# Patient Record
Sex: Female | Born: 1955 | Hispanic: No | Marital: Married | Smoking: Former smoker
Health system: Western US, Academic
[De-identification: ages and names within clinical notes are randomized; demographics above are authoritative.]

## PROBLEM LIST (undated history)

## (undated) DIAGNOSIS — B1920 Unspecified viral hepatitis C without hepatic coma: Secondary | ICD-10-CM

## (undated) DIAGNOSIS — J449 Chronic obstructive pulmonary disease, unspecified: Secondary | ICD-10-CM

## (undated) HISTORY — DX: Chronic obstructive pulmonary disease, unspecified: J44.9

## (undated) HISTORY — DX: Unspecified viral hepatitis C without hepatic coma: B19.20

## (undated) HISTORY — PX: NO SURGICAL HISTORY: 101002

---

## 2012-01-11 ENCOUNTER — Ambulatory Visit
Admit: 2012-01-11 | Discharge: 2012-01-11 | Disposition: A | Discharge status: Home / Self Care | Payer: BLUE CROSS/BLUE SHIELD | Attending: PULMONARY DISEASE | Admitting: PULMONARY DISEASE

## 2012-01-15 ENCOUNTER — Ambulatory Visit
Admit: 2012-01-15 | Discharge: 2012-01-15 | Disposition: A | Discharge status: Home / Self Care | Payer: BLUE CROSS/BLUE SHIELD

## 2012-02-02 ENCOUNTER — Ambulatory Visit
Admit: 2012-02-02 | Discharge: 2012-02-02 | Disposition: A | Discharge status: Home / Self Care | Payer: BLUE CROSS/BLUE SHIELD

## 2012-06-30 ENCOUNTER — Ambulatory Visit: Payer: Self-pay

## 2012-07-05 ENCOUNTER — Ambulatory Visit: Payer: Self-pay | Admitting: Internal Medicine

## 2012-07-12 ENCOUNTER — Encounter: Payer: Self-pay | Admitting: Internal Medicine

## 2012-07-12 ENCOUNTER — Ambulatory Visit: Admitting: Internal Medicine

## 2012-07-12 ENCOUNTER — Ambulatory Visit

## 2012-07-12 VITALS — BP 119/86 | HR 92 | Temp 96.3°F | Resp 16 | Ht 65.0 in | Wt 135.8 lb

## 2012-07-12 DIAGNOSIS — B1920 Unspecified viral hepatitis C without hepatic coma: Secondary | ICD-10-CM | POA: Insufficient documentation

## 2012-07-12 DIAGNOSIS — J449 Chronic obstructive pulmonary disease, unspecified: Secondary | ICD-10-CM | POA: Insufficient documentation

## 2012-07-12 MED ORDER — AZITHROMYCIN 250 MG TABLET
ORAL_TABLET | ORAL | Status: AC
Start: 2012-07-12 — End: 2013-07-12

## 2012-07-12 MED ORDER — TIOTROPIUM BROMIDE 18 MCG CAPSULE WITH INHALATION DEVICE
1.0000 | ORAL_CAPSULE | Freq: Every day | RESPIRATORY_TRACT | Status: AC
Start: 2012-07-12 — End: 2013-07-12

## 2012-07-12 MED ORDER — LEVALBUTEROL HFA 45 MCG/ACTUATION AEROSOL INHALER
2.0000 | INHALATION_SPRAY | RESPIRATORY_TRACT | Status: AC | PRN
Start: 2012-07-12 — End: 2013-07-07

## 2012-07-12 MED ORDER — AZITHROMYCIN 250 MG TABLET
ORAL_TABLET | ORAL | Status: DC
Start: 2012-07-12 — End: 2012-07-12

## 2012-07-12 MED ORDER — PREDNISONE 20 MG TABLET
20.0000 mg | ORAL_TABLET | Freq: Every day | ORAL | Status: AC
Start: 2012-07-12 — End: 2013-07-07

## 2012-07-12 NOTE — Patient Instructions (Signed)
In order to provide the best care to our patients we request that if you have any questions about today's visit or need to have studies scheduled please call the [facility] directly at [phone].  If you have a change in your condition and need to schedule an earlier appointment or need to change your scheduled appointment please contact our office at [phone].  Please do not call the [facility] to change any appointments.      Please notify us if you develop increased shortness of breath, chest pain, dizziness or fainting, increased swelling of feet and ankles, a temperature greater than 100, a cough with colored sputum, nausea, vomiting or diarrhea.  Please weigh yourself every morning at the same time. If you gain more than 3 pounds overnight or 5 pounds in one week please let us know so we can adjust your medications.  If you have other symptoms that are concerning or abnormal for you, please don't hesitate to contact us at [phone].    If you have problems after hours, on weekends or holidays, please page the on-call nurse at [phone].

## 2012-07-12 NOTE — Nursing Note (Signed)
>>   Rae Lips, MA     Tue Jul 12, 2012  1:07 PM  Patient is in room #1    Medication list given at the time of check in for review by patient, instructed patient to indicate to the doctor any corrections that need to be made for Medication Reconciliation.    Patient identifiers obtained. vital signs taken, screened for pain, allergies checked, patient roomed, and Kathrynn Running, MD notified.         Rae Lips, MA I    >> Valentino Hue, RN     Tue Jul 12, 2012 12:52 PM  Patient arrived, VSS, spo2 90% on RA, put on nasal canula 2 liters and improved, deneis any pain, Md aware and roomed in room #3.  Valentino Hue, RN

## 2012-07-12 NOTE — Progress Notes (Addendum)
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  Sylvia Carter and Sylvia Carter live in [place].. The patient was seen and examined with Jamse Arn, RRT, AE-C in a new consultation for Stage IV Very Severe COPD recently oxygen dependent on 2 L/min, Hepatitis C (no record of liver ultrasound), and rhinosinusitis for assessment of COPD control.  I reviewed the history of the present illness, including the chief complaint and interval history.  I repeated and confirmed key aspects of the physical examination as recorded by Ms. Vukovich in the EMR. I personally reviewed the images from the diagnostic studies, reviewed the chart and laboratory values.  We discussed the case and jointly formulated our recommendations. I concur with the evaluation and recommendations outlined by Ms. Vukovich below.    SYMPTOMS include SOB.  Modifying factors include rest and albuterol which when taken works in less than a minute.    Current IMPAIRMENT evidenced by symptoms and albuterol rescue and Asthma Control Test score of 11.    According to the NIH-NAEPP Expert Panel Report 3, an Asthma Control Test score between 5 and 15 indicates Very Poorly Controlled asthma, 16 to 19 Not Well Controlled asthma, and 20 to 25 Well Controlled asthma.    RISK of future acute asthma exacerbation > 3 per year.    Zero Emergency department visits, zero urgent care clinic visits, zero prednisone bursts.    REVIEW OF SYSTEMS (ROS) All systems were reviewed and are negative except as noted above.     PHYSICAL EXAMINATION  Filed Vitals:    07/12/12 1247   BP: 119/86   Pulse: 92   Temp: 35.7 C (96.3 F)   Resp: 16   Height: 1.651 m (5\' 5" )   Weight: 61.598 kg (135 lb 12.8 oz)   SpO2: 90%     Constitutional: The patient is a cachectic woman.  Ear, Nose, Mouth & Throat:  Oropharynx is benign.  no oral thrush.  Mallampati 4.  No palpable lymphadenopathy.  Sinus non-tender  no drainage.  Respiratory: Chest  clear with distant breath sounds at the apices but normal breath sounds at the bases.  Cardiovascular:  Cardiac murmur no.  Abdomen: Nontender. Trudeau sign no.  Skin: Without rash  Musculoskeletal: Peripheral edema none.  Neuro: Focal neurological findings none.    LABORATORY AND RADIOLOGIC FINDINGS  I personally reviewed and interpreted the following:  Office spirometry FEV1 0.56(37% predicted), FVC 1.45(45% predicted), FEV1/FVC ratio 39%.  Bronchodilator response present with improvement in FVC and FEV1 by 44% and 37% after 3 puffs of Xopenex MDI.  Flow-volume loop is COPD  PFTs done 02/03/2012 revealed FEV1 0.56 L and severely reduced DLco.  Following administration of bronchodilator albuterol there was a significant bronchodilator response  Chest X-Sylvia Carter abnormal with lung hyperinflation and hyperlucent apices, interstitial changes at the bases.  PET-CT scan negative for malignancy  ECHO with main, right, left PA 3.1 cm, and 2.4 cm and 2.4 cm.  No saddle emboli found.  Serum chemistries labs done revealed a chronic respiratory acidosis with metabolic compensation    IMPRESSION  1. COPD, Stage or Grade 4 Very Severe  2. GERD?  3. Rhinosinusitis, allergic/nonallergic?  4. Vocal cord dysfunction    RECOMMENDATIONS  Medical Decision Making  Established problem and new problems recognized today.  Additional workup planned.    [facility] asthma action plan  Continue Xolair injections    Asthma education provided by our [facility]  Coordinator and RRT  Black Box warnings from FDA regarding LABA-containing medications reviewed with patient    Clinical trial of one with...  Breo Ellipta 100/25 every day  Add Spiriva in one week  Total IgE  RAST Panel  Flu vaccine given.  We do not have the Pertussis vaccine.    Consider Daliresp 500 mcg every day to reduce exacerbations  Work closely with the good physicians in [facility].  Will need a second round at [facility] to complement [facility]    Face-to-face time of this new patient visit is 60 minutes. Greater than 70 % of the time was spent discussing the patient's disease, prognosis, symptoms, and treatment plan, including the proper use of inhalers.  The goals of chronic disease management were defined:  Control symptoms and prevent exacerbations.  Reduce impairment and risk from asthma to achieve better asthma control.  Discussed the importance of recognizing the difference between DANGER and DISCOMFORT from asthma.    Jamse Arn, RRT, AE-C and I thank you for the opportunity to consult on this interesting case.  The patient will return in 4 weeks at which time I will correspond with you once again.  With all good wishes, I remain    Electronically signed by:    Kathrynn Running, Johnette Abraham

## 2012-07-12 NOTE — Progress Notes (Signed)
Sylvia Carter is a 56yr old female seen for evaluation of recently diagnosed COPD, possibly asthma/COPD overlap syndrome.  Diagnosed with COPD five months ago; she is oxygen dependant on 2L. She is in a pulmonary rehabilitation program close to her home in [place].  Reports  history of several bouts of pneumonia and frequent sinus infections in childhood.  Current respiratory symptoms include DOE. No PND.  ADLs severely limited due  to capacity due to DOE. No orthopnea, occasional GERD symptoms. Symptoms have been worse in winter for several years.   No ED visits,  hospital admissions,  unplanned primary care physician office visits or prednisone uses in the past year for respiratory problems. No sinus infections in the past year, but she has had them in years past. Positive symptoms of rhinosinusitis.  Patient has a 30 pack year history of smoking, quit this year. Patient was raised in a smoking household. No history of intubation, she has a daughter with asthma. Patient has two indoor dogs.   Co-morbid conditions include hepatitis C, history of narrow angle glaucoma, chronic rhinosinusitis.  Meds: Xopenex mdi prn (3-4 x/week), Dulera 100 1 puff bid  Pre/post bronchodilator spirometry was perfomed by me with the following results FEV1 .41/.56, +37%, 16% pred, FVC 1.00/1.45, +45%, 32% pred, FEV1% 41/38.6, FEF25-75 .27/43, +59%, 10% pred, PEF 88/112, +27%, 25% pred.  Asthma Control Test was administered and the score was 11  Plan: RAST IgE, Zpak & prednisone for rescue. Trial Breo, begin Spiriva after one week.  Asthma/COPD Education was provided, use of 24-hour on-call pager, asthma action plan, device techniques were reviewed with patient     Jamse Arn, RRT, RCP, AE-C  Clinical Coordinator, [facility]

## 2012-08-16 ENCOUNTER — Ambulatory Visit

## 2012-08-30 ENCOUNTER — Ambulatory Visit: Admitting: Internal Medicine

## 2012-09-05 ENCOUNTER — Ambulatory Visit: Admit: 2012-09-05 | Discharge: 2012-09-05

## 2012-09-05 ENCOUNTER — Ambulatory Visit: Admitting: Internal Medicine

## 2012-09-05 ENCOUNTER — Ambulatory Visit

## 2012-09-05 VITALS — BP 142/84 | HR 85 | Temp 96.3°F | Resp 20 | Ht 65.0 in | Wt 141.2 lb

## 2012-09-05 NOTE — Patient Instructions (Signed)
   Expect a call from us in approximately 10 to 15 days business days to schedule your Pulmonary Function Test & Exercise Oximetry, if you do not hear from us within 15 business days please give us a call at [phone] and ask for the pre-authorization coordinator

## 2012-09-05 NOTE — Nursing Note (Signed)
>>   Rae Lips, MA     Mon Sep 05, 2012 10:53 AM  Patient is in room #4    Medication list given at the time of check in for review by patient, instructed patient to indicate to the doctor any corrections that need to be made for Medication Reconciliation.    Patient identifiers obtained. vital signs taken, screened for pain, allergies checked, patient roomed, and Kathrynn Running, MD notified.         Rae Lips, MA I

## 2012-09-13 ENCOUNTER — Ambulatory Visit: Admitting: Internal Medicine

## 2012-09-14 NOTE — Progress Notes (Signed)
Sylvia Carter is a 57yr old female seen for follow up of ACOS.  PFTs show very severe obstruction. RV 234%, TLC 149%, DLco 46%. RAST negative, IgE 270.   Has not used O2 at night for the past 3 months, but does use it with exertion. Has been through a pulmonary rehab program near her home.  Reports one or two asthma episodes, no nocturnal dyspnea, no ED visits, hospital admissions, unplanned primary care physician office visits or prednisone uses since last [facility] appointment.  Rescue drug use qd-bid.  Meds: Spiriva, Breo/Symbicort 160, Xopenex, nasal irrigation.  Spirometry was not obtained in clinic.  Asthma Control Test was administered and the score was 16. eNO 75 ppb.  Plan: Increase nasal irrigation to qd, begin Rhinocort.  Asthma Education was provided, use of 24-hour on-call pager, asthma action plan, device techniques were reviewed with patient.     Jamse Arn, RRT, RCP, AE-C  Clinical Coordinator, [facility]

## 2012-09-18 ENCOUNTER — Encounter: Payer: Self-pay | Admitting: Internal Medicine

## 2012-09-18 NOTE — Progress Notes (Signed)
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  Mykenzi and Ray live in [place]. The patient was seen and examined with Jamse Arn, RRT, AE-C in a new consultation for severe persistent asthma and Stage IV Very Severe COPD which was until recently oxygen dependent on 2 L/min, Hepatitis C (no record of liver ultrasound), and rhinosinusitis for assessment of Asthma/COPD control.  I reviewed the history of the present illness, including the chief complaint and interval history.  I repeated and confirmed key aspects of the physical examination as recorded by Ms. Vukovich in the EMR. I personally reviewed the images from the diagnostic studies, reviewed the chart and laboratory values.  We discussed the case and jointly formulated our recommendations. I concur with the evaluation and recommendations outlined by Ms. Vukovich below.    SYMPTOMS include SOB.  Modifying factors include rest and albuterol which when taken works in less than a minute.  Since her first visit and trial with Breo Ellipta 100/25 + Spiriva Pairlee is much improved.  No oxygen use in the past THREE MONTHS to Ray's consternation.    Current IMPAIRMENT evidenced by symptoms and albuterol rescue and Asthma Control Test score of 16, significantly up from 11.    According to the NIH-NAEPP Expert Panel Report 3, an Asthma Control Test score between 5 and 15 indicates Very Poorly Controlled asthma, 16 to 19 Not Well Controlled asthma, and 20 to 25 Well Controlled asthma.    RISK of future acute asthma exacerbation > 3 per year.    Zero Emergency department visits, zero urgent care clinic visits, zero prednisone bursts.    REVIEW OF SYSTEMS (ROS) All systems were reviewed and are negative except as noted above.     PHYSICAL EXAMINATION  Filed Vitals:    09/05/12 1058   BP: 142/84   Pulse: 85   Temp: 35.7 C (96.3 F)   TempSrc: Tympanic   Resp: 20   Height: 1.651 m (5\' 5" )   Weight: 64.048  kg (141 lb 3.2 oz)   SpO2: 90%     Constitutional: The patient is a cachectic woman.  Ear, Nose, Mouth & Throat:  Oropharynx is benign.  no oral thrush.  Mallampati 4.  No palpable lymphadenopathy.  Sinus non-tender  no drainage.  Respiratory: Chest clear with distant breath sounds at the apices but normal breath sounds at the bases.  Cardiovascular:  Cardiac murmur no.  Abdomen: Nontender. Trudeau sign no.  Skin: Without rash  Musculoskeletal: Peripheral edema none.  Neuro: Focal neurological findings none.    LABORATORY AND RADIOLOGIC FINDINGS  I personally reviewed and interpreted the following:  Office spirometry FEV1 0.56(37% predicted), FVC 1.45(45% predicted), FEV1/FVC ratio 39%.  Bronchodilator response present with improvement in FVC and FEV1 by 44% and 37% after 3 puffs of Xopenex MDI.  Flow-volume loop is COPD  PFTs done 02/03/2012 revealed FEV1 0.56 L and severely reduced DLco.  Following administration of bronchodilator albuterol there was a significant bronchodilator response  Chest X-ray abnormal with lung hyperinflation and hyperlucent apices, interstitial changes at the bases.  PET-CT scan negative for malignancy  ECHO with main, right, left PA 3.1 cm, and 2.4 cm and 2.4 cm.  No saddle emboli found.  Serum chemistries labs done revealed a chronic respiratory acidosis with metabolic compensation    Total IgE 270  FeNO today 75 ppb on drug treatment    IMPRESSION  1. COPD, Stage  or Grade 4 Very Severe  2. GERD?  3. Rhinosinusitis, allergic/nonallergic?  4. Vocal cord dysfunction  5. Hepatitis C - Needs to know there is TREATMENT NOW!    RECOMMENDATIONS  Medical Decision Making  Established problem and new problems recognized today.  Additional workup planned.    [facility] asthma action plan  Continue Breo + Spiriva with PRN Xopenex  Nasal irrigation daily  Begin Rhinocort  Asthma education provided by our [facility] and RRT  Black Box warnings from FDA regarding LABA-containing medications  reviewed with patient     Consider Daliresp 500 mcg every day to reduce exacerbations  Work closely with the good physicians in [facility]  Pulmonary Rehabilitaton.  Will need a second round at [facility] to complement [facility] program    Consider referral to Hepatology at [facility].    Face-to-face time of this new patient visit is 30 minutes. Greater than 70 % of the time was spent discussing the patient's disease, prognosis, symptoms, and treatment plan, including the proper use of inhalers.  The goals of chronic disease management were defined:  Control symptoms and prevent exacerbations.  Reduce impairment and risk from asthma to achieve better asthma control.  Discussed the importance of recognizing the difference between DANGER and DISCOMFORT from asthma.    Jamse Arn, RRT, AE-C and I thank you for the opportunity to consult on this interesting case.  The patient will return in 4 weeks at which time I will correspond with you once again.  With all good wishes, I remain    Electronically signed by:    Kathrynn Running, Johnette Abraham

## 2012-10-17 ENCOUNTER — Ambulatory Visit

## 2012-10-17 ENCOUNTER — Ambulatory Visit: Admitting: Internal Medicine

## 2012-10-17 VITALS — BP 146/87 | HR 79 | Temp 97.4°F | Resp 18 | Wt 146.1 lb

## 2012-10-17 NOTE — Progress Notes (Signed)
PULMONARY CLINIC   FOLLOW-UP NOTE    Reason for follow-up: COPD.    [No PMD listed to address this letter to]    I had the pleasure of seeing Sylvia Carter our [facility] in follow-up. As you may know shes is a pleasant 57 year old white woman with history of severe COPD (FEV1 0.78 L at 29%, and DLCO of 46%) on supplemental oxygen.     She has experienced much improvement on Breo (the new combination ICS/LABA inhaler) and spiriva. Breo inhaler causes mild hoarseness but is otherwise well tolerated. Denies cough, sputum production, dyspnea, or chest pain.     Uses oxygen only as needed, and not using it at night as instructed. This is due to some concern about cosmetic appearance of using O2 publically, etc.     She has completed a pulm rehab program up in [place] where she lives. Mrs Carter states she benefited from it considerably. She will start the maintenance program as well.     In clinic today, ACT 18/25 and FeNO was 50 ppb.    ROS:  A complete and comprehensive ROS was negative except for the pertinent items mentioned above.    IMMUNIZATIONS:  Influenza: UTD Oct 2013   Pneumovax: UTD 2013    MEDICATIONS:  Current Outpatient Prescriptions on File Prior to Visit   Medication Sig Dispense Refill    Azithromycin (ZITHROMAX Z-PAK) 250 mg Tablet Take 2 pills first day, then 1 pill daily till gone  6 tablet  0    Levalbuterol (XOPENEX HFA) 45 mcg/actuation Inhaler Take 2 puffs by inhalation every 4 hours if needed.  15 g  11    PredniSONE (DELTASONE) 20 mg Tablet Take 1 tablet by mouth every morning after a meal. Take 40 mg for 5 days then stop  20 tablet  0    Tiotropium (SPIRIVA WITH HANDIHALER) 18 mcg Capsule (MUST use with HandiHaler Device) Take 1 capsule by inhalation every day. Inhale the contents of one capsule in the handihaler once daily.  Do NOT swallow capsule.  30 capsule  12     No current facility-administered medications on file prior to visit.     CXR from 09/05/12  reviewed. Findings consistent with COPD and air-trapping.     PHYSICAL EXAM:  VS- BP 146/87  Pulse 79  Temp(Src) 36.3 C (97.4 F) (Tympanic)  Resp 18  Wt 66.271 kg (146 lb 1.6 oz)  BMI 24.31 kg/m2  SpO2 90% on room air.       GEN- No acute distress, pleasant.  HEENT- PERRL, EOMI, naso- and oropharynx is clear without lesions.   NECK- Supple, no LAD or JVD.  CHEST- CTA B, no wheezing, rhonchi, or crackles. Normal effort. Dec BS in bases, but no dullness.   CV- RRR, no murmurs, rubs, or gallops. S1 and S2 are audible.  ABD- Soft, +bs, nt/nd.  EXT- No c/c/e.  SKIN- No rash.  NEURO- Non-focal exam. Normal gait.      LABS:  All labs in EMR were reviewed and discussed with the patient since the last visit.  Has negative RAST panel put positive and high IgE at 270.   CRP < 0.1      PFT and from Jan 2014 reviewed in EMR.    ASSESSMENT & RECOMMENDATIONS:   (1) COPD vs. ACOS: Phenotype is consistent with non-exacerbator, no systemic inflammation based on above tests and history. However, presence of high IgE is suggestive  of ACOS (asthma COPD overlap syndrome) but not necessarily diagnostic. My biggest concern is the degree of low FEV1 and DLCO in such a young patient, and her lack of proper O2 use. Based on her past test, she needs up to 4 LPM supplemental O2 with walking. She also is not using O2 at night when she sleeps as recommended. I spent more than half of the visit educating the patient regarding her condition and reviewing the medical literature. I counseled her that O2 has a mortality benefit in COPD, and why it is important for her to use it essentially 24/7. She appreciated our discussion and plans to do so immediately.     Plan:  - Pulm Rehab -- start maintenance program  - Check noctural pulse ox --> report back to [place] here in clinic  - Use supplemental O2 continuous at night and with walking; best if used 24/7 continuous  - Cont inhaler and Breo  - Cont spiriva    Thank you for allowing me to  participate in the care of Mrs. Sylvia Carter.  I would like to see the patient back in follow-up in 2-3 months.     Time Spent: 26 Minutes (including history, physical, and review of labs and imaging studies, answering questions, and discussion of plan, of which >50% of the time was spent counseling the patient).      Sincerely,     Electronically signed by:  Corie Chiquito. Andrey Campanile, MD  Attending Physician  Assistant Professor of Medicine  Pulmonary, Critical Care, & Sleep Medicine  [facility]

## 2012-10-17 NOTE — Nursing Note (Signed)
>>   Matilde Sprang, LVN     Mon Oct 17, 2012 11:16 AM  Patient is in room #1    Medication list given at the time of check in for review by patient, instructed patient to indicate to the doctor any corrections that need to be made for Medication Reconciliation.    Patient identifiers obtained. vital signs taken, screened for pain, allergies checked, patient roomed, and doctor notified.       Contact Phone number---------[phone]   Message ok ------------------yes  Name--------------------------Sylvia Carter  DOB --------------------------- 1956-05-15  E-mail --------------------------No e-mail address on record    Ht Readings from Last 1 Encounters:  09/05/12 : 1.651 m (5\' 5" ) Wt Readings from Last 3 Encounters:  09/05/12 : 64.048 kg (141 lb 3.2 oz)  07/12/12 : 61.598 kg (135 lb 12.8 oz)     BP Readings from Last 3 Encounters:  09/05/12 : 142/84  07/12/12 : 119/86      Emeline Darling, LVN

## 2012-10-21 NOTE — Progress Notes (Signed)
Jamayia Croker is a 57yr old female seen with Dr. Andrey Campanile for follow up of ACOS.  Denies exacerbations, nocturnal dyspnea,  ED visits, hospital admissions, unplanned primary care physician office visits, antibiotic or prednisone uses since last [facility] appointment. Use of oxygen only with exertion.  Rescue drug use once in the past week.  Complains of increased hoarseness since starting Breo.  Meds:   Current Outpatient Prescriptions on File Prior to Visit   Medication Sig Dispense Refill    Azithromycin (ZITHROMAX Z-PAK) 250 mg Tablet Take 2 pills first day, then 1 pill daily till gone  6 tablet  0    Levalbuterol (XOPENEX HFA) 45 mcg/actuation Inhaler Take 2 puffs by inhalation every 4 hours if needed.  15 g  11    PredniSONE (DELTASONE) 20 mg Tablet Take 1 tablet by mouth every morning after a meal. Take 40 mg for 5 days then stop  20 tablet  0    Tiotropium (SPIRIVA WITH HANDIHALER) 18 mcg Capsule (MUST use with HandiHaler Device) Take 1 capsule by inhalation every day. Inhale the contents of one capsule in the handihaler once daily.  Do NOT swallow capsule.  30 capsule  12     No current facility-administered medications on file prior to visit.     Marland Kitchen  Spirometry was not performed.   Asthma Control Test was administered and the score was 18. eNO 50  Plan: Obtain overnight pulse oximetry study. Alternate Symbicort & Breo.  Asthma Education was provided, use of 24-hour on-call pager, asthma action plan, device techniques were reviewed with patient.     Jamse Arn, RRT, RCP, AE-C  Clinical Coordinator, [facility]

## 2012-10-24 ENCOUNTER — Encounter: Payer: Self-pay | Admitting: Internal Medicine

## 2012-10-30 NOTE — Addendum Note (Signed)
Addended by: Kathrynn Running on: 10/30/2012 01:25 AM     Modules accepted: Level of Service

## 2012-12-07 ENCOUNTER — Telehealth: Payer: Self-pay | Admitting: Internal Medicine

## 2012-12-07 ENCOUNTER — Ambulatory Visit

## 2012-12-07 NOTE — Telephone Encounter (Signed)
Called and spoke with patient.  Three patient identifiers obtained.  Rescheduled patient's appointment to 02/07/13 at 2:30pm with Dr. Magnus Ivan.  Appointment reminder mailed to patient.  She is aware and verbalizes understanding.  Rae Lips, MA I

## 2012-12-07 NOTE — Telephone Encounter (Signed)
This patient. Was scheduled for a July appt. At 9am, but due to the fact of her traveling from [place], she was requesting a later time, due to fix-50 delays, she would like to be seen in June, possibly afternoon, or the latest time that is available, pls advise and patient will be called.  Rowe Robert

## 2012-12-07 NOTE — Telephone Encounter (Signed)
Patient calling stating that she missed a call, but not sure who it was from and they called her husbands phone.  Telephone encounter not documented.    [phone] (house #)    Thank you  Neomia Glass

## 2013-02-07 ENCOUNTER — Ambulatory Visit: Payer: BLUE CROSS/BLUE SHIELD | Attending: Internal Medicine | Admitting: Internal Medicine

## 2013-02-07 VITALS — BP 124/72 | HR 101 | Temp 96.2°F | Resp 20 | Ht 65.0 in | Wt 144.0 lb

## 2013-02-07 DIAGNOSIS — B1920 Unspecified viral hepatitis C without hepatic coma: Secondary | ICD-10-CM | POA: Insufficient documentation

## 2013-02-07 DIAGNOSIS — R498 Other voice and resonance disorders: Secondary | ICD-10-CM | POA: Insufficient documentation

## 2013-02-07 DIAGNOSIS — J4489 Other specified chronic obstructive pulmonary disease: Secondary | ICD-10-CM | POA: Insufficient documentation

## 2013-02-07 DIAGNOSIS — J309 Allergic rhinitis, unspecified: Secondary | ICD-10-CM | POA: Insufficient documentation

## 2013-02-07 MED ORDER — UMECLIDINIUM 62.5 MCG-VILANTEROL 25 MCG/ACTUATION POWDR FOR INHALATION
1.0000 | DISK | Freq: Every day | RESPIRATORY_TRACT | Status: DC
Start: 2013-02-07 — End: 2014-02-07

## 2013-02-07 NOTE — Nursing Note (Signed)
>>   Sylvia Carter     Tue Feb 07, 2013  2:00 PM    Patient is in room #2    Medication list given at the time of check in for review by patient, instructed patient to indicate to the doctor any corrections that need to be made for Medication Reconciliation.    Patient identifiers obtained. vital signs taken, screened for pain, allergies checked, patient roomed, and Kathrynn Running, MD notified.           Sylvia Prjevlotzky MA

## 2013-02-10 ENCOUNTER — Encounter: Payer: Self-pay | Admitting: Internal Medicine

## 2013-02-10 NOTE — Progress Notes (Signed)
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  Sylvia Carter and Sylvia Carter live in [place]. The patient was seen and examined with Sylvia Carter, RRT, AE-C in a new consultation for severe persistent asthma and Stage IV Very Severe COPD which was until recently oxygen dependent on 2 L/min, Hepatitis C (no record of liver ultrasound), and rhinosinusitis for assessment of Asthma/COPD control.  I reviewed the history of the present illness, including the chief complaint and interval history.  I repeated and confirmed key aspects of the physical examination as recorded by Sylvia Carter in the EMR. I personally reviewed the images from the diagnostic studies, reviewed the chart and laboratory values.  We discussed the case and jointly formulated our recommendations. I concur with the evaluation and recommendations outlined by Sylvia Carter below.    SYMPTOMS include SOB.  Modifying factors include rest and albuterol which when taken works in less than a minute.  Since her first visit and trial with Breo Ellipta 100/25 + Spiriva Sylvia Carter is much improved.  No oxygen use but still significantly disabled.  Papers filled out.  COPD is the 2nd leading cause of disability in the [place], not a well-known fact..    Current IMPAIRMENT evidenced by symptoms and albuterol rescue and Asthma Control Test score of 16 still, significantly up from 11.    According to the NIH-NAEPP Expert Panel Report 3, an Asthma Control Test score between 5 and 15 indicates Very Poorly Controlled asthma, 16 to 19 Not Well Controlled asthma, and 20 to 25 Well Controlled asthma.    RISK of future acute asthma exacerbation > 3 per year.    Zero Emergency department visits, zero urgent care clinic visits, zero prednisone bursts.    REVIEW OF SYSTEMS (ROS) All systems were reviewed and are negative except as noted above.     PHYSICAL EXAMINATION  Filed Vitals:    02/07/13 1354   BP: 124/72   Pulse: 101   Temp:  35.7 C (96.2 F)   TempSrc: Tympanic   Resp: 20   Height: 1.651 m (5\' 5" )   Weight: 65.318 kg (144 lb)   SpO2: 90%     Constitutional: The patient is a cachectic woman.  Ear, Nose, Mouth & Throat:  Oropharynx is benign.  no oral thrush.  Mallampati 4.  No palpable lymphadenopathy.  Sinus non-tender  no drainage.  Respiratory: Chest clear with distant breath sounds at the apices but normal breath sounds at the bases.  Cardiovascular:  Cardiac murmur no.  Abdomen: Nontender. Trudeau sign no.  Skin: Without rash  Musculoskeletal: Peripheral edema none.  Neuro: Focal neurological findings none.    LABORATORY AND RADIOLOGIC FINDINGS  I personally reviewed and interpreted the following:  Office spirometry done last time FEV1 0.56(37% predicted), FVC 1.45(45% predicted), FEV1/FVC ratio 39%.  Bronchodilator response present with improvement in FVC and FEV1 by 44% and 37% after 3 puffs of Xopenex MDI.  Flow-volume loop is COPD  PFTs done 02/03/2012 revealed FEV1 0.56 L and severely reduced DLco.  Following administration of bronchodilator albuterol there was a significant bronchodilator response  Chest X-Sylvia Carter abnormal with lung hyperinflation and hyperlucent apices, interstitial changes at the bases.  PET-CT scan negative for malignancy  ECHO with main, right, left PA 3.1 cm, and 2.4 cm and 2.4 cm.  No saddle emboli found.  Serum chemistries labs done revealed a chronic respiratory acidosis with metabolic compensation    Total  IgE 270  FeNO last time 75 ppb on drug treatment.  Makes me think of ACOS    IMPRESSION  1. 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 GERD?  3. Rhinosinusitis, allergic/nonallergic?  4. Vocal cord dysfunction  5. Hepatitis C - Needs to know there is TREATMENT NOW!    RECOMMENDATIONS  Medical Decision Making  Established problem and new problems recognized today.  Additional workup planned.    UCAN asthma action plan  Continue Breo + Spiriva with PRN Xopenex  Nasal  irrigation daily  Asthma education provided by our [facility] and RRT  Black Box warnings from FDA regarding LABA-containing medications reviewed with patient     Consider Daliresp 500 mcg every day to reduce exacerbations  Work closely with the good physicians in [facility]  Pulmonary Rehabilitaton.  Will need a second round at [facility] to complement [facility] program    Consider referral to Hepatology at [facility].    Face-to-face time of this  patient visit is 40 minutes. Greater than 70 % of the time was spent discussing the patient's disease, prognosis, symptoms, and treatment plan, including the proper use of inhalers.  The goals of chronic disease management were defined:  Control symptoms and prevent exacerbations.  Reduce impairment and risk from asthma to achieve better asthma control.  Discussed the importance of recognizing the difference between DANGER and DISCOMFORT from asthma.    Sylvia Carter, RRT, AE-C and I thank you for the opportunity to consult on this interesting case.  The patient will return in 3 months at which time I will correspond with you once again.  With all good wishes, I remain    Electronically signed by:    Sylvia Carter, Sylvia Carter

## 2013-02-14 ENCOUNTER — Ambulatory Visit: Admitting: Internal Medicine

## 2013-08-29 ENCOUNTER — Other Ambulatory Visit: Payer: Self-pay | Admitting: Internal Medicine

## 2013-08-29 DIAGNOSIS — J449 Chronic obstructive pulmonary disease, unspecified: Principal | ICD-10-CM

## 2013-08-29 NOTE — Progress Notes (Signed)
Rc'd call from patient's daughter who reports her mother has been ill with a sinus infection but was reluctant to call herself. Per Dr. William Hamburger, patient was advised to take five days of azithromycin, which she has on hand and to come in to clinic on 2/2/ at 10 AM. She was further advised we would like to obtain new PFT & 6MW as her tests are now a year old. We will try to coordinate the testing on the same day as her appt w/ [place] as she comes from quite a distance. She was also told to please contact us with worsening symptoms or with any questions she may have.  Sherlie Ban, RRT, AE-C, CCM, TTS  Coordinator, [facility]

## 2013-09-11 ENCOUNTER — Ambulatory Visit: Payer: BLUE CROSS/BLUE SHIELD | Admitting: PULMONARY DISEASE

## 2013-10-02 ENCOUNTER — Encounter: Payer: BLUE CROSS/BLUE SHIELD | Admitting: PULMONARY DISEASE

## 2013-10-09 ENCOUNTER — Ambulatory Visit: Payer: BLUE CROSS/BLUE SHIELD | Admitting: PULMONARY DISEASE

## 2013-11-10 ENCOUNTER — Ambulatory Visit: Payer: BLUE CROSS/BLUE SHIELD

## 2013-11-20 ENCOUNTER — Ambulatory Visit
Admit: 2013-11-20 | Discharge: 2013-11-20 | Disposition: A | Discharge status: Home / Self Care | Payer: BLUE CROSS/BLUE SHIELD | Source: Ambulatory Visit | Attending: Internal Medicine | Admitting: Internal Medicine

## 2013-11-20 ENCOUNTER — Ambulatory Visit (HOSPITAL_BASED_OUTPATIENT_CLINIC_OR_DEPARTMENT_OTHER): Payer: BLUE CROSS/BLUE SHIELD | Admitting: PULMONARY DISEASE

## 2013-11-20 VITALS — BP 134/87 | HR 78 | Temp 97.6°F | Resp 16 | Ht 65.0 in | Wt 141.1 lb

## 2013-11-20 DIAGNOSIS — J310 Chronic rhinitis: Secondary | ICD-10-CM

## 2013-11-20 DIAGNOSIS — J449 Chronic obstructive pulmonary disease, unspecified: Principal | ICD-10-CM

## 2013-11-20 DIAGNOSIS — Z23 Encounter for immunization: Secondary | ICD-10-CM | POA: Insufficient documentation

## 2013-11-20 DIAGNOSIS — J4489 Other specified chronic obstructive pulmonary disease: Secondary | ICD-10-CM | POA: Insufficient documentation

## 2013-11-20 MED ORDER — FLUTICASONE PROPIONATE 50 MCG/ACTUATION NASAL SPRAY,SUSPENSION
2.0000 | Freq: Every day | NASAL | Status: AC
Start: 2013-11-20 — End: 2014-11-20

## 2013-11-20 MED ORDER — TIOTROPIUM BROMIDE 18 MCG CAPSULE WITH INHALATION DEVICE
1.0000 | ORAL_CAPSULE | Freq: Every day | RESPIRATORY_TRACT | Status: DC
Start: 2013-11-20 — End: 2015-02-11

## 2013-11-20 MED ORDER — FLUTICASONE 100 MCG-VILANTEROL 25 MCG/DOSE BREATH ACTIVATED ORAL INHALER
1.0000 | DISK | Freq: Every day | RESPIRATORY_TRACT | Status: DC
Start: 2013-11-20 — End: 2014-08-17

## 2013-11-20 MED ORDER — LANSOPRAZOLE 30 MG CAPSULE,DELAYED RELEASE
1.0000 | DELAYED_RELEASE_CAPSULE | Freq: Every day | ORAL | Status: AC
Start: 2013-11-20 — End: 2014-11-20

## 2013-11-20 MED ORDER — LEVALBUTEROL CONCENTRATE 1.25 MG/0.5 ML SOLUTION FOR NEBULIZATION
1.2500 mg | INHALATION_SOLUTION | RESPIRATORY_TRACT | Status: DC | PRN
Start: 2013-11-20 — End: 2013-11-21

## 2013-11-20 MED ORDER — PREDNISONE 20 MG TABLET
40.0000 mg | ORAL_TABLET | Freq: Every day | ORAL | Status: AC
Start: 2013-11-20 — End: 2014-11-15

## 2013-11-20 MED ORDER — TIOTROPIUM HANDIHALER DEVICE
2.0000 | Freq: Every day | Status: AC
Start: 2013-11-20 — End: 2014-12-21

## 2013-11-20 MED ORDER — AZITHROMYCIN 250 MG TABLET
ORAL_TABLET | ORAL | Status: AC
Start: 2013-11-20 — End: 2014-11-20

## 2013-11-20 MED ORDER — ROFLUMILAST 500 MCG TABLET
500.0000 ug | ORAL_TABLET | Freq: Every day | ORAL | Status: AC
Start: 2013-11-20 — End: 2014-11-21

## 2013-11-20 NOTE — Nursing Note (Signed)
>>   Charyl Bigger, RN     Mon Nov 20, 2013  1:41 PM  Sylvia Carter is a 58yr old female  3 patient identifiers used  Immunization VIS documentation(s) were given to patient to review. All questions were answered and the patient consented to the Immunization(s) being given. Patient allergies were reviewed and no contraindications were found. The immunization Prevnar 13 was given as ordered. The patient was observed for any immediate reactions to the vaccine. None were observed.  Charyl Bigger, RN, BSN  Pulmonary Resource Nurse/Case [facility]    Direct phone for patients [phone]   Internal direct line [phone]  Pager [phone]  Fax [phone]    >> ANA GODINEZ, MA     Mon Nov 20, 2013 10:50 AM  Medication list given at the time of check in for review by patient, instructed patient to indicate to the doctor any corrections that need to be made for Medication Reconciliation.    Patient identifiers obtained. vital signs taken, screened for pain, allergies checked, patient roomed, and Sylvia Hartshorn, MD notified.     Lance Morin, [place]

## 2013-11-21 ENCOUNTER — Telehealth: Payer: Self-pay | Admitting: Internal Medicine

## 2013-11-21 MED ORDER — LEVALBUTEROL 1.25 MG/3 ML SOLUTION FOR NEBULIZATION
INHALATION_SOLUTION | RESPIRATORY_TRACT | Status: AC
Start: 2013-11-21 — End: 2014-11-22

## 2013-11-21 NOTE — Telephone Encounter (Signed)
[facility] faxed correction to Levalbuterol which was ordered 1.25 mg/0.5 ml nebulized solution.    New order for correct medication (1.25 mg/3 ml) sent to [facility] per Dr. William Hamburger and Hurshel Keys.    Charyl Bigger, RN, BSN  Pulmonary Resource Nurse/Case [facility]    Direct phone for patients [phone]   Internal direct line [phone]  Pager [phone]  Fax [phone]

## 2013-11-21 NOTE — Telephone Encounter (Signed)
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 Phone: [phone]    Medication #1: Spiriva with Handihaler - sig Take 1 capsule by inhalation every day. Inhale the contents of one capsule in the handihaler once daily.     Medication #2: sig - Tiotropium HandiHaler Device - sig 2 puffs every day.    I confirmed Medication #1. Is Medication #2 accurate/new or error that wasn't discontinued?    Please Advise.  Thank you  [name]     Charyl Bigger, RN, BSN  Pulmonary Resource Nurse/Case [facility]    Direct phone for patients [phone]   Internal direct line [phone]  Pager [phone]  Fax [phone]

## 2013-11-21 NOTE — Progress Notes (Signed)
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  Sylvia Carter and Sylvia Carter live in [place]. The patient was seen and examined with Sylvia Carter, RRT, AE-C in a new consultation for severe persistent asthma and Stage IV Very Severe COPD which was until recently oxygen dependent on 2 L/min with exercise, Hepatitis C (no record of liver ultrasound), and rhinosinusitis for assessment of Asthma/COPD control.  I reviewed the history of the present illness, including the chief complaint and interval history.  I repeated and confirmed key aspects of the physical examination as recorded by Sylvia Carter in the EMR. I personally reviewed the images from the diagnostic studies, reviewed the chart and laboratory values.  We discussed the case and jointly formulated our recommendations. I concur with the evaluation and recommendations outlined by Sylvia Carter below.    Since her last visit, Sylvia Carter got very ill with the URI going around.  Nasty upper and lower respiratory tract infection and is finally getting a little better.  No fever, chills or sweats.  No chest pain.  No hemoptysis.  In the past Sylvia Carter would have been hospitalized, according to [place].  Overall, Sylvia Carter is back to her baseline with no change in her baseline SOB and DOE but her PFTs done recently suggest otherwise.  Who do I believe?  The patient of course!     On 09/05/12  FVC 2.92 (85%) FEV1 0.78 (29%)  TLC 7.65 (149% )  DLco 10.9 (46% predicted)  On 11/20/13  FVC 2.32 (67%) FEV1 0.59 (22%)  TLC 6.65(128%) DLco 8.0 (34% predicted)    SYMPTOMS include SOB.  Modifying factors include rest and albuterol which when taken works in less than a minute.  Since her first visit and trial with Breo Ellipta 100/25 + Spiriva [place] is much improved.  No oxygen use but still significantly disabled.  Papers filled out.  COPD is the 2nd leading cause of disability in the [place], not a well-known fact..     Current IMPAIRMENT evidenced by symptoms and albuterol rescue and Asthma Control Test score of 16 still, significantly up from 11.    According to the NIH-NAEPP Expert Panel Report 3, an Asthma Control Test score between 5 and 15 indicates Very Poorly Controlled asthma, 16 to 74 Not Well Controlled asthma, and 20 to 25 Well Controlled asthma.    RISK of future acute asthma exacerbation > 3 per year.    Zero Emergency department visits, zero urgent care clinic visits, zero prednisone bursts.    REVIEW OF SYSTEMS (ROS) All systems were reviewed and are negative except as noted above.     PHYSICAL EXAMINATION  Filed Vitals:    11/20/13 1016   BP: 134/87   Pulse: 78   Temp: 36.4 C (97.6 F)   TempSrc: Tympanic   Resp: 16   Height: 1.651 m (5\' 5" )   Weight: 64.003 kg (141 lb 1.6 oz)   SpO2: 90%     Constitutional: The patient is a cachectic woman.  Ear, Nose, Mouth & Throat:  Oropharynx is benign.  no oral thrush.  Mallampati 4.  No palpable lymphadenopathy.  Sinus non-tender  no drainage.  Respiratory: Chest clear with distant breath sounds at the apices but normal breath sounds at the bases.  Cardiovascular:  Cardiac murmur no.  Abdomen: Nontender. Trudeau sign no.  Skin: Without rash  Musculoskeletal: Peripheral edema none.  Neuro: Focal neurological findings none.  LABORATORY AND RADIOLOGIC FINDINGS  I personally reviewed and interpreted the following:  Office spirometry done last time FEV1 0.56(37% predicted), FVC 1.45(45% predicted), FEV1/FVC ratio 39%.  Bronchodilator response present with improvement in FVC and FEV1 by 44% and 37% after 3 puffs of Xopenex MDI.  Flow-volume loop is COPD    PFTs done at [facility]  On 09/05/12  FVC 2.92 (85%) FEV1 0.78 (29%)  TLC 7.65 (149% )  DLco 10.9 (46% predicted)  On 11/20/13  FVC 2.32 (67%) FEV1 0.59 (22%)  TLC 6.65(128%) DLco 8.0 (34% predicted)    PFTs done 02/03/2012 revealed FEV1 0.56 L and severely reduced DLco.   Following administration of bronchodilator albuterol there was a significant bronchodilator response  Chest X-Sylvia Carter abnormal with lung hyperinflation and hyperlucent apices, interstitial changes at the bases.  PET-CT scan negative for malignancy  ECHO with main, right, left PA 3.1 cm, and 2.4 cm and 2.4 cm.  No saddle emboli found.  Serum chemistries labs done revealed a chronic respiratory acidosis with metabolic compensation    Total IgE 270  FeNO last time 75 ppb on drug treatment.  Makes me think of ACOS    IMPRESSION  1. 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 GERD?  3. Rhinosinusitis, allergic/nonallergic?  4. Vocal cord dysfunction  5. Hepatitis C - Needs to know there is TREATMENT NOW!  6. Low DLco - I do not believe it    RECOMMENDATIONS  Medical Decision Making  Established problem and new problems recognized today.  Additional workup planned.    UCAN asthma action plan  Continue Breo  Add Spiriva once every day  Trial with Daliresp 500 mcg every day  POC for supplemental oxygen  Allegra D  CT scan of her chest without contrast     Consider Daliresp 500 mcg every day to reduce exacerbations  Work closely with the good physicians in [facility].  Will need a second round at [facility] to [place] program    Consider referral to Hepatology at [facility].    Face-to-face time of this  patient visit is 40 minutes. Greater than 70 % of the time was spent discussing the patient's disease, prognosis, symptoms, and treatment plan, including the proper use of inhalers.  The goals of chronic disease management were defined:  Control symptoms and prevent exacerbations.  Reduce impairment and risk from asthma to achieve better asthma control.  Discussed the importance of recognizing the difference between DANGER and DISCOMFORT from asthma.    Sylvia Carter, RRT, AE-C and I thank you for the opportunity to consult  on this interesting case.  The patient will return in 3 months at which time I will correspond with you once again.  With all good wishes, I remain    Electronically signed by:    Leana Roe, Darol Destine

## 2013-11-23 MED FILL — pneumococcal 13-val conj vaccine-dip crm (PF) 0.5 mL IM syringe: INTRAMUSCULAR | Qty: 0.5 | Status: AC

## 2013-11-24 ENCOUNTER — Other Ambulatory Visit: Payer: Self-pay | Admitting: Internal Medicine

## 2013-11-24 DIAGNOSIS — J449 Chronic obstructive pulmonary disease, unspecified: Principal | ICD-10-CM

## 2013-11-26 LAB — PULMONARY FUNCTION TEST COMPLETE  (PFT) - PULMONARY LAB
DLCO (PRE): 8 mL/mmHg/min
FEV1 (PRE): 0.59 L (ref 0–12)
FEV1/FVC (PRE): 25 % (ref 0–12)
FVC (PRE): 2.32 L (ref 0–12)
TLC (PRE): 6.65 L (ref 0.05–11.99)

## 2013-11-26 NOTE — Progress Notes (Addendum)
Sylvia Carter is a 58 yo WF seen for f/u of her Stage IV very severe COPD. She required a Zpak approx. 1 1/2 months ago for acute bronchitis and narrowly missed an ED visit. No interval hospital admissions. She remains on oxygen 3 L via nasal cannula qhs and sometimes during exertion but not always. She does not like wearing it and is not always conscientious with it.  She had PFTs done today which show an FEV1 of 22%, TLC 128%, RV of 217% and DLco of 8.0/34% Dr. William Hamburger questions such a significant overall drop in 15 months time.   Patient had a PET scan and chest CT at [facility] but we cannot find the results in our EMR.  She reports allergy symptoms and was advised to begin Allegra and nasal irrigation.  All medications are MDI/DPI format and she requires a pulmo aid, which was ordered through [facility] DME. Xopenex solution was ordered as well.    A 3 part oxygen study was performed with the following results:    On room air with exertion  O2 sat: 85  HR: 110    3L O2 with exertion  O2 sat: 93  HR: 89    On room air at rest  O2 sat: 96  HR 80    Current Outpatient Prescriptions on File Prior to Visit   Medication Sig Dispense Refill    umeclidinium-vilanterol (ANORO ELLIPTA) 62.5-25 mcg/actuation Disk with Device Take 1 puff by inhalation every day.  1 each  11     No current facility-administered medications on file prior to visit.     Spirometry was not obtained.  Plan: Obtain POC through local DME.      Sherlie Ban, RRT, CCM, AE-C  ROAD Program Coordinator

## 2013-11-30 ENCOUNTER — Telehealth: Payer: Self-pay | Admitting: Internal Medicine

## 2013-11-30 DIAGNOSIS — J449 Chronic obstructive pulmonary disease, unspecified: Principal | ICD-10-CM

## 2013-11-30 MED ORDER — IPRATROPIUM 0.5 MG-ALBUTEROL 3 MG (2.5 MG BASE)/3 ML NEBULIZATION SOLN
3.0000 mL | INHALATION_SOLUTION | Freq: Four times a day (QID) | RESPIRATORY_TRACT | Status: AC
Start: 2013-11-30 — End: 2014-11-25

## 2013-12-18 ENCOUNTER — Ambulatory Visit: Payer: BLUE CROSS/BLUE SHIELD | Attending: PULMONARY DISEASE | Admitting: PULMONARY DISEASE

## 2013-12-18 VITALS — BP 137/92 | HR 83 | Temp 97.5°F | Ht 65.0 in | Wt 140.8 lb

## 2013-12-18 DIAGNOSIS — J4489 Other specified chronic obstructive pulmonary disease: Secondary | ICD-10-CM | POA: Insufficient documentation

## 2013-12-18 DIAGNOSIS — J449 Chronic obstructive pulmonary disease, unspecified: Principal | ICD-10-CM

## 2013-12-18 NOTE — Nursing Note (Signed)
>>   [name], LVN     Mon Dec 18, 2013 10:59 AM  Patient arrived to clinic . Medication list given at the time of check in for review. Patient instructed to notify MD of any corrections that need to be made for Medication Reconciliation. Verbalized understanding.  V/S taken, allergies verified. Dr Zenia Resides notified that patient is ready to be seen.  [name], LVN

## 2013-12-19 NOTE — Progress Notes (Signed)
RE:  Sylvia Carter, Sylvia Carter  MR#:  [id number]  DOB:  1955/10/27  Date of Service:  12/18/2013        Dear Dr. :    This is a 58 year old woman who was referred by my colleague, Dr.  Leana Roe for evaluation and consideration for potential orthotopic  lung transplant.  She carries a diagnosis of advanced stage IV chronic  obstructive lung disease, who has been dependent on supplemental  oxygen only with exercise.  In addition to her other issues as far as  her lung process, she also has a history of hepatitis C. and  apparently tells me she has been evaluated for this and not found to  have any significant evidence of cirrhosis.  She has had a recent  exacerbation that did not require hospitalization or ER presentation  but apparently was associated with a significant lower respiratory  tract infection and generalized worsening of her condition.  Her  pulmonary function tests have demonstrated a downward trend, in terms  of her lung function, in that she had PFTs on 01/27, with a forced  vital capacity of 2.9, 85% of predicted.  On 04/13, 2.32, 67% of  predicted.  A similar decrease in FEV1 was noted at 0.78, 29% of  predicted, down to 0.59, 22% of predicted.  She also has had a decline  in her total lung capacity, going from 7.65 to 6.65, but both still  reflect severe hyperinflation.  Diffusing capacity is 834% of  predicted.  She does not endorse PND or orthopnea.  She does not, when  she is between exacerbations, have large quantities of sputum, and  certainly has not had any episodes of hemoptysis.  She tells me that  she had a PET scan because she had been noted to have a parenchymal  lung abnormality that she believes dates back to childhood when she  had a serious pneumonia as a child that left significant scarring in  her chest.    On review of systems, she has been noted to have a stable weight.  No  fevers, chills, or night sweats.  No sleep-disordered breathing   complaints.  No skin rashes or skin lesions.  No history of glaucoma  or significant visual impairments and no significant dental issues.  She states that she had a negative cardiac stress test roughly two  years ago.  Denies any exertional chest pain, pressure or  palpitations, etc. She is not aware of a positive skin test for TB or  tuberculin contacts.  Musculoskeletal history is negative.  No history  of thyroid disease or diabetes.    Studies I have available for review include a 6-minute walk test  performed on 11/20/2013, with alk desaturation below 93% on 3 L, a  total of 316 meters with a Borg scale of 2.5.  Her pulmonary function  tests were as outlined above.  Chest x-ray, PA and lateral shows  marked hyperinflation.  No other obvious diagnostic abnormalities.  No  cardiac workup is available for me today.  Her current weight is 140  pounds.  She is 5 feet 5 inches tall.  Pulse is 83 and regular, blood  pressure 137/92.  She was 96% saturated at rest on room air.  HEENT  was unremarkable. There was no obvious lymphadenopathy.  Thyroid seems  normal.  Carotids are 2+.  She does have increased AP diameter and  decreased heart tones and breath sounds.  Abdomen grossly benign.  No  clubbing, cyanosis  or edema.    I had a long discussion with the patient and her family today, greater  than roughly 40 minutes of the entire encounter was given to educating  the patient about lung transplant process.  We covered the issues in  terms of timing of transplant workup, lung allocation score,  transplant center designation, prognosis posttransplant versus  management of her severe emphysema medically.  She was unaware of the  fact that COPD does not convey a survival advantage with orthotopic  lung transplant, and is concerned and wishes to think through whether  she wants to move ahead with a lung transplant evaluation.  From my  perspective, I am not sure  she is at that juncture just yet, and that  I estimate her lung allocation score to be in the low 30s and clearly  the temper of her disease has not reflected multiple presentations to  the emergency room, hospitalizations, or continuous oxygen  supplementation need.  At this point, she wishes to consult with her  husband and daughter who were both present at the meeting today,  before making further decisions regarding timing and moving ahead.  I  have suggested a close followup with repeat lung function tests the  next 3-4 months, as well as a 6-minute walk to help determine rigidity  of the rapidity at which her lung disease is declining and better set  a time for initiating a transplant workup and evaluation.        Report Electronically Signed - 12/20/2013 11:55:19 by  Creed Copper, MD  Professor  Internal Medicine  Pulmonary And Critical Care Medicine    RPA/so  D:  12/18/2013 11:59:06 PDT/PST  T:  12/19/2013 11:24:43 PDT/PST  Job #:  [id number] / [id number]

## 2013-12-28 ENCOUNTER — Other Ambulatory Visit: Payer: Self-pay | Admitting: Internal Medicine

## 2013-12-28 DIAGNOSIS — J449 Chronic obstructive pulmonary disease, unspecified: Principal | ICD-10-CM

## 2013-12-28 NOTE — Progress Notes (Signed)
Order placed in error.  Hillery Hunter RRT  Coordinator

## 2014-01-18 NOTE — Addendum Note (Signed)
Addended by: Crecencio Mc on: 01/18/2014 03:00 PM      Modules accepted: Orders

## 2014-04-18 ENCOUNTER — Other Ambulatory Visit: Payer: Self-pay | Admitting: Internal Medicine

## 2014-04-18 DIAGNOSIS — J449 Chronic obstructive pulmonary disease, unspecified: Principal | ICD-10-CM

## 2014-04-20 ENCOUNTER — Ambulatory Visit: Payer: BLUE CROSS/BLUE SHIELD | Attending: PULMONARY DISEASE | Admitting: PULMONARY DISEASE

## 2014-04-20 ENCOUNTER — Ambulatory Visit
Admit: 2014-04-20 | Discharge: 2014-04-20 | Disposition: A | Discharge status: Home / Self Care | Payer: BLUE CROSS/BLUE SHIELD | Source: Ambulatory Visit | Attending: PULMONARY DISEASE | Admitting: PULMONARY DISEASE

## 2014-04-20 VITALS — BP 132/87 | HR 87 | Temp 98.6°F | Resp 16 | Ht 66.0 in | Wt 137.2 lb

## 2014-04-20 DIAGNOSIS — J449 Chronic obstructive pulmonary disease, unspecified: Principal | ICD-10-CM

## 2014-04-20 DIAGNOSIS — J4489 Other specified chronic obstructive pulmonary disease: Secondary | ICD-10-CM | POA: Insufficient documentation

## 2014-04-20 DIAGNOSIS — Z9981 Dependence on supplemental oxygen: Secondary | ICD-10-CM | POA: Insufficient documentation

## 2014-04-20 NOTE — Nursing Note (Signed)
Patient is in room #3    Medication list given at the time of check in for review by patient, instructed patient to indicate to the doctor any corrections that need to be made for Medication Reconciliation.    Patient identifiers obtained. vital signs taken, screened for pain, allergies checked, patient roomed, and Sylvia P Allen, MD notified.     Sylvia Fellows, MA

## 2014-04-20 NOTE — Patient Instructions (Addendum)
Future Appointments  Date Time Provider [place]   10/19/2014 2:40 PM Creed Copper, MD [facility]       In order to provide the best care to our patients we request that if you have any questions about todays visit or need to have studies scheduled please call the Advanced Lung Disease Office directly at [phone].  If you have a change in your condition and need to schedule an earlier appointment or need to change your scheduled appointment please contact our office at [phone].  Please do not call the [facility] to change any appointments.      Please notify us if you develop increased shortness of breath, chest pain, dizziness or fainting, increased swelling of feet and ankles, a temperature greater than 100, a cough with colored sputum, nausea, vomiting or diarrhea.  Please weigh yourself every morning at the same time. If you gain more than 3 pounds overnight or 5 pounds in one week please let us know so we can adjust your medications.  If you have other symptoms that are concerning or abnormal for you, please dont hesitate to contact us at [phone].    If you have problems after hours, on weekends or holidays, please page the on-call nurse at [phone].

## 2014-04-24 NOTE — Progress Notes (Signed)
RE:  Sylvia Carter, Sylvia Carter  MR#:  [id number]  DOB:  May 08, 1956  Date of Service:  04/20/2014        Dear Dr.     The patient returns today to follow up with pulmonary function test,  as well as a 6-minute walk. She is oxygen-dependent, severe chronic  obstructive lung disease, was being considered for potential  orthotopic lung transplant.  I spent a significant time in my  consultative initial visit with her last time, discussing transplant  as a potential option. At that time she and her husband were unclear  whether they wish to proceed and she returns today with updated  studies to consider to further her options as far as transplant event.  She has remained stable.  She does not have any change really in her  exertion tolerance.  She has not noted any significant lower extremity  edema.  She has not visited the emergency department, required  hospitalization, and has been maintaining her appetite without  difficulty. She actually describes herself as quite functional given  her limitations, as long as she uses her supplemental oxygen.    Studies:  She did have repeat pulmonary function tests today which  show her FEV1 to be 0.61, 23% of predicted; with an FEV1/FVC ratio of  21%. She did have studies from 06/13 at [facility] were her FEV1 was  0.56 so again very similar studies that we had two years ago.  She was  able to ambulate 342 meters with a heart rate maximum of 111 and a  nadir saturation of 91% on 2 L of nasal cannula oxygen.  She still has  diminished breath sounds,  no jugular venous distention, no obvious  lymphadenopathy,  distant heart tones, benign abdomen and no dependent  edema.    Assessment:  She remains severely impaired with advanced chronic  obstructive lung disease.  I spoke with she and her husband again and  answered their questions about transplant as an option. At this  juncture, she is not willing to move forward and I concur with this to  a certain  extent as her lung allocation score, I think would be in the  low 30s and therefore not put her in high probability of getting any  significant lung offers for orthotopic lung transplant.  We will  continue to reassess her. I have encouraged her to return if she  notices any clinical change in her functional activity or disease  activity itself, with the anticipation that we would move forward  based on that with a lung transplant evaluation.    Sincerely yours,        Report Electronically Signed - 04/24/2014 11:37:38 by  Creed Copper, MD  Professor  Internal Medicine  Pulmonary And Critical Care Medicine    RPA/so  D:  04/20/2014 17:56:21 PDT/PST  T:  04/24/2014 10:49:35 PDT/PST  Job #:  [id number] / [id number]

## 2014-04-29 LAB — PULMONARY FUNCTION TEST COMPLETE  (PFT) - PULMONARY LAB
DLCO (PRE): 9.5 mL/mmHg/min
FEV1 (PRE): 0.61 L (ref 0–12)
FEV1/FVC (PRE): 21 % (ref 0–12)
FVC (PRE): 2.93 L (ref 0–12)
TLC (PRE): 7.08 L (ref 0.05–11.99)

## 2014-08-17 ENCOUNTER — Other Ambulatory Visit: Payer: Self-pay | Admitting: Internal Medicine

## 2014-08-17 MED ORDER — FLUTICASONE 100 MCG-VILANTEROL 25 MCG/DOSE BREATH ACTIVATED ORAL INHALER
1.0000 | DISK | Freq: Every day | RESPIRATORY_TRACT | Status: AC
Start: 2014-08-17 — End: 2015-02-15

## 2014-09-07 ENCOUNTER — Encounter: Payer: Self-pay | Admitting: Internal Medicine

## 2014-10-19 ENCOUNTER — Ambulatory Visit: Payer: BLUE CROSS/BLUE SHIELD | Admitting: PULMONARY DISEASE

## 2015-01-28 ENCOUNTER — Other Ambulatory Visit: Payer: Self-pay | Admitting: Internal Medicine

## 2015-01-31 NOTE — Telephone Encounter (Signed)
Refill request for Spiriva. Spoke with pt in regards to which lung doctor she is seeing since last appointment with Dr. Zenia Resides was cancelled. Per pt, she is no longer seeing Dr. Zenia Resides and would like to go back and see Dr. Leana Roe.  Dr. William Hamburger had originally prescribed her Spiriva.  Advised her to call Dr. Claria Dice office and set up a follow up appointment and we will forward refill to Dr. William Hamburger.  If she does decide to see Dr. Zenia Resides to let us know and we will refill her med.    Janeece Fitting, RN

## 2015-02-11 ENCOUNTER — Other Ambulatory Visit: Payer: Self-pay | Admitting: Internal Medicine

## 2015-02-13 NOTE — Telephone Encounter (Signed)
Verbal authorization received from Dr. William Hamburger for refill on Spiriva Handihaler.    Charyl Bigger, RN. BSN  Pulmonary Resource Press photographer phone for patients [phone]    Internal direct line (STAFF ONLY) [phone] (with voicemail)   Internal direct line (STAFF ONLY) [phone] (URGENT LINE without voicemail)  Pager [phone]  Fax [phone]

## 2015-02-15 ENCOUNTER — Other Ambulatory Visit: Payer: Self-pay | Admitting: PULMONARY DISEASE

## 2015-02-15 MED ORDER — TIOTROPIUM BROMIDE 18 MCG CAPSULE WITH INHALATION DEVICE
18.0000 ug | ORAL_CAPSULE | Freq: Every day | RESPIRATORY_TRACT | Status: DC
Start: 2015-02-15 — End: 2015-10-08

## 2015-02-15 NOTE — Telephone Encounter (Signed)
Medication refilled per prescription refill by clinic RN standard procedure.Lareta Bruneau, RN

## 2015-04-05 ENCOUNTER — Other Ambulatory Visit: Payer: Self-pay

## 2015-04-05 DIAGNOSIS — J449 Chronic obstructive pulmonary disease, unspecified: Principal | ICD-10-CM

## 2015-04-05 MED ORDER — ALBUTEROL SULFATE HFA 90 MCG/ACTUATION AEROSOL INHALER
1.0000 | INHALATION_SPRAY | RESPIRATORY_TRACT | 6 refills | Status: AC | PRN
Start: 2015-04-05 — End: 2016-03-30

## 2015-04-05 MED ORDER — BUDESONIDE-FORMOTEROL HFA 160 MCG-4.5 MCG/ACTUATION AEROSOL INHALER
2.0000 | INHALATION_SPRAY | Freq: Two times a day (BID) | RESPIRATORY_TRACT | 3 refills | Status: DC
Start: 2015-04-05 — End: 2015-10-08

## 2015-04-05 NOTE — Progress Notes (Signed)
Rc'd call from pt who who recently tx'd by PMD for URI w/ abx. She would like a f/u appt, will be seen 10/10 at 1100. She reports having thrush from [facility] and that she is nearly out of albuterol, so per Dr. Julaine Fusi, orders were placed for both Symbicort 160 and albuterol MDI.  Sherlie Ban, RRT  ROAD Coordinator

## 2015-05-20 ENCOUNTER — Ambulatory Visit: Payer: BLUE CROSS/BLUE SHIELD | Attending: PULMONARY DISEASE | Admitting: PULMONARY DISEASE

## 2015-05-20 ENCOUNTER — Ambulatory Visit: Payer: BLUE CROSS/BLUE SHIELD

## 2015-05-20 VITALS — BP 127/83 | HR 90 | Temp 98.4°F | Resp 18 | Ht 65.0 in | Wt 133.0 lb

## 2015-05-20 DIAGNOSIS — J449 Chronic obstructive pulmonary disease, unspecified: Principal | ICD-10-CM

## 2015-05-20 DIAGNOSIS — B1920 Unspecified viral hepatitis C without hepatic coma: Secondary | ICD-10-CM

## 2015-05-20 DIAGNOSIS — K219 Gastro-esophageal reflux disease without esophagitis: Secondary | ICD-10-CM

## 2015-05-20 LAB — CBC WITH DIFFERENTIAL
BASOPHILS % AUTO: 0.5 %
BASOPHILS ABS AUTO: 0 10*3/uL (ref 0–0.2)
EOSINOPHIL % AUTO: 4 %
EOSINOPHIL ABS AUTO: 0.3 10*3/uL (ref 0–0.5)
HEMATOCRIT: 43.7 % (ref 36–46)
HEMOGLOBIN: 14.6 g/dL (ref 12.0–16.0)
LYMPHOCYTE ABS AUTO: 2.2 10*3/uL (ref 1.0–4.8)
LYMPHOCYTES % AUTO: 35.4 %
MCH: 31.2 pg (ref 27–33)
MCHC: 33.4 % (ref 32–36)
MCV: 93.5 UM3 (ref 80–100)
MONOCYTES % AUTO: 8.3 %
MONOCYTES ABS AUTO: 0.5 10*3/uL (ref 0.1–0.8)
MPV: 9.2 UM3 (ref 6.8–10.0)
NEUTROPHIL ABS AUTO: 3.3 10*3/uL (ref 1.80–7.70)
NEUTROPHILS % AUTO: 51.8 %
PLATELET COUNT: 274 10*3/uL (ref 130–400)
RDW: 12.5 U (ref 0–14.7)
RED CELL COUNT: 4.67 10*6/uL (ref 4.0–5.2)
WHITE BLOOD CELL COUNT: 6.3 10*3/uL (ref 4.5–11.0)

## 2015-05-20 LAB — COMPREHENSIVE METABOLIC PANEL
ALANINE TRANSFERASE (ALT): 36 U/L (ref 5–54)
ALBUMIN: 4.1 g/dL (ref 3.2–4.6)
ALKALINE PHOSPHATASE (ALP): 36 U/L (ref 35–115)
ASPARTATE TRANSAMINASE (AST): 35 U/L (ref 15–43)
BILIRUBIN TOTAL: 0.6 mg/dL (ref 0.3–1.3)
CALCIUM: 9.2 mg/dL (ref 8.6–10.5)
CARBON DIOXIDE TOTAL: 31 meq/L (ref 24–32)
CHLORIDE: 100 meq/L (ref 95–110)
CREATININE BLOOD: 0.58 mg/dL (ref 0.44–1.27)
GLUCOSE: 94 mg/dL (ref 70–99)
POTASSIUM: 3.9 meq/L (ref 3.3–5.0)
PROTEIN: 7.6 g/dL (ref 6.3–8.3)
SODIUM: 139 meq/L (ref 135–145)
UREA NITROGEN, BLOOD (BUN): 12 mg/dL (ref 8–22)

## 2015-05-20 LAB — C-REACTIVE PROTEIN

## 2015-05-20 LAB — INR: INR: 1.01 (ref 0.87–1.18)

## 2015-05-20 MED ORDER — ROFLUMILAST 500 MCG TABLET
1.0000 | ORAL_TABLET | Freq: Every day | ORAL | 11 refills | Status: AC
Start: 2015-05-20 — End: 2016-05-14

## 2015-05-20 MED ORDER — PREDNISONE 20 MG TABLET
20.0000 mg | ORAL_TABLET | Freq: Every day | ORAL | 0 refills | Status: AC
Start: 2015-05-20 — End: 2016-05-19

## 2015-05-20 MED ORDER — TIOTROPIUM 2.5 MCG-OLODATEROL 2.5 MCG/ACTUATION MIST FOR INHALATION
2.0000 | Freq: Every day | RESPIRATORY_TRACT | 11 refills | Status: AC
Start: 2015-05-20 — End: 2016-05-14

## 2015-05-20 MED ORDER — PREDNISONE 20 MG TABLET
20.0000 mg | ORAL_TABLET | Freq: Every day | ORAL | 0 refills | Status: DC
Start: 2015-05-20 — End: 2015-05-20

## 2015-05-20 MED ORDER — FLUTICASONE 110 MCG/ACTUATION AEROSOL ORAL INHALER
2.0000 | INHALATION_SPRAY | Freq: Two times a day (BID) | RESPIRATORY_TRACT | 3 refills | Status: AC
Start: 2015-05-20 — End: 2016-05-14

## 2015-05-20 MED ORDER — AZITHROMYCIN 250 MG TABLET
ORAL_TABLET | ORAL | 0 refills | Status: AC
Start: 2015-05-20 — End: 2016-05-19

## 2015-05-20 NOTE — Progress Notes (Signed)
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 She also has a history of GERD and VCD and HCV.  Last seen in April 2015 by Dr. William Hamburger, at the time was on breo; spiriva and trial of Daliresp was intended.  She is concurrently being seen by Dr Zenia Resides for consideration of lung transplant.  At their last visit in Sept 2015 due to a lower allocation score and patient's own reservations regarding surgery, this has not been pursued further.      HISTORY OF PRESENT ILLNESS  Today patient states her symptoms are stable to improved.  She did have a flare recently and was instructed by our clinic to complete a zpack.  Currently feeling back to baseline.  She uses a rescue inhaler about every other day.  Previous issue with thrush with her current ICS.  Overall however despite her recent improvement she does state she feels more tired and fatigued over the past 2 years.  She reports good compliance with 24h oxygen at 2L.  Of note review of prior records (see media panel) show that in 2013 she had a 1.9 cm lingular opacity on outside CT chest.  Follow-up PET CT a few weeks later showed complete resolution.      IN-OFFICE DATA  FEV1: 0.59 23%  FVC: 1.48 47%  FEV1/FVC: 0.39  FeNO: not done  CAT score: not done    REVIEW OF SYSTEMS  All systems were reviewed and were negative unless otherwise noted in the HPI and above.    HOME MEDICATIONS  Prior to Admission medications    Medication Sig Start Date End Date Taking? Authorizing Provider   Albuterol (PROAIR HFA, PROVENTIL HFA, VENTOLIN HFA) 90 mcg/actuation inhaler Take 1-2 puffs by inhalation every 4 hours if needed. 04/05/15 03/30/16  Hubbard Hartshorn, MD   Budesonide/Formoterol [facility]) 160-4.5 mcg/actuation HFA Aerosol Inhaler Inhaler Take 2 puffs by inhalation 2 times daily. 04/05/15 03/30/16  Hubbard Hartshorn, MD   Fluticasone (FLOVENT  HFA) 110 mcg/actuation Inhaler Take 2 puffs by inhalation every 12 hours. 05/20/15 05/14/16 Yes Hubbard Hartshorn, MD   Roflumilast (DALIRESP) 500 mcg Tablet Tablet Take 1 tablet by mouth every day. 05/20/15 05/14/16 Yes Hubbard Hartshorn, MD   Tiotropium (SPIRIVA WITH HANDIHALER) 18 mcg Capsule (MUST use with HandiHaler Device) Take 1 capsule by inhalation every day. Inhale the contents of one capsule in the handihaler once daily.  Do NOT swallow capsule. 02/15/15 02/15/16  Roblee Loleta Chance, MD   tiotropium-olodaterol (STIOLTO RESPIMAT) 2.5-2.5 mcg/actuation Mist Take 2 puffs by inhalation every day. 05/20/15 05/14/16 Yes Hubbard Hartshorn, MD         PHYSICAL EXAM:  Vital Signs  Temp: 36.9 C (98.4 F) (10/10 1031)  Temp src: --  Pulse: 90 (10/10 1031)  BP: 127/83 (10/10 1031)  Resp: 18 (10/10 1031)  SpO2: 96 % (10/10 1031)  Height: 165.1 cm (5\' 5" ) (10/10 1031)  Weight: 60.3 kg (133 lb) (10/10 1031)    GEN: well appearing, pleasant  HEENT: eomi, mmm, Mallampati 1  CV: distant, but rrr no m/r/g  PULM: clear, no wheezing, tactile fremitus is equal  ABD: soft ntnd  EXT: no edema    LABORATORY STUDIES  Minimal lab work available for review.  RAST panel is pan negative as of  2013.  IgE 270.      IMAGING/PROCEDURES  PFTs  04/20/2014 Severe flow limitation on expiratory limb.  FVC 2.93 85%, FEV1 0.61 23%, FEV/FCV 0.21, TLC 136%, FRC 180%, RV 206%, RV/TLC 0.59.  DLCO 41%    6 Min Walk  04/20/2014 342 m on ambient air.  Starting SpO2 95%, nadir 91%, ending 95%    CXR  09/05/2012: hyperlucent airways with large lung volumes.  Diaphragms are flattened.      CT  None    Echocardiography  None    ASSESSMENT AND PLAN  1) COPD possible ACOS  2) GERD  3) HCV    Symptomatically seems to be stable/improve however objectively we see a substantial worsening in her PFTs.  In particular her FVC has substantially worsened from her 2015 PFTs and today's in office spirometry.  Would presume that the degree of air trapping on her prior PFTs has worsened, but no new  formal PFT for comparison.  She has tried pulm rehab in the past and has not liked it.  Tries to work out on a treadmill at home intstead.  Over all she has progressive disease, is relatively young.  Will try to stablize her progress as much as possible with more aggressive medical therapy.  She should continue to follow up with Dr. Zenia Resides regarding her candidacy for lung transplant.      Plan:  - will switch to stioloto and flovent  - also written for trial of daliresp to be started in 1 mo  - CT chest non-con low dose for screening given her 30 pack year smoking hx  - repeat labs including alpha 1 anti-trypsin, CBC w/diff, CMP, RAST, IgE  - prescribed prednisone and zpack to have on hand as part of asthma action plan  - influenza today  - recommended that she and her husband obtain CD of images from outside facility for CT and PET CT from June 2013  - continue home O2  - f/u with Dr Zenia Resides re: lung transplant question  - lastly patient was re-referred to hepatology to address hx of untreated HCV.  Hepatitis panel, HCV ab, and genotype was ordered    Patient was seen and discussed with attending Dr. Marinus Maw [facility]  Pulmonary Critical Care Fellow  Pgr [phone]

## 2015-05-20 NOTE — Patient Instructions (Signed)
Please contact [facility] to schedule your CT Chest appointment at [phone].

## 2015-05-20 NOTE — Nursing Note (Signed)
Patient was verified with 2 identifiers, vital signs taken, allergies verified, patient was screened for pain. Patient comes in ambulatory with oxygen.    Patient is in ROOM # 1    Sylvia Mickelsen M. Stancil Deisher, MA

## 2015-05-20 NOTE — Progress Notes (Signed)
Sylvia Carter is a 59yr old female seen for follow up of COPD  One course of antibiotics, zpak, for acute bronchitis. No prednisone uses, nocturnal dyspnea, ED visits, hospital admissions, or unplanned primary care physician office viisits since last [facility] appointment.  QOD rescue drug use.  Meds:   Current Outpatient Prescriptions on File Prior to Visit   Medication Sig Dispense Refill    Albuterol (PROAIR HFA, PROVENTIL HFA, VENTOLIN HFA) 90 mcg/actuation inhaler Take 1-2 puffs by inhalation every 4 hours if needed. 1 inhaler 6    Budesonide/Formoterol (SYMBICORT) 160-4.5 mcg/actuation HFA Aerosol Inhaler Inhaler Take 2 puffs by inhalation 2 times daily. 1 inhaler 3    Tiotropium (SPIRIVA WITH HANDIHALER) 18 mcg Capsule (MUST use with HandiHaler Device) Take 1 capsule by inhalation every day. Inhale the contents of one capsule in the handihaler once daily.  Do NOT swallow capsule. 30 capsule 11     No current facility-administered medications on file prior to visit.      Spirometry was perfomed by me with the following results FEV1 1.48/47, FVC 0.59/23, FEV1% 39.8, FEF25-75 0.31/12, PEF 100/28  Asthma Control Test was administered and the score was 15. eNO not done  Plan: Stop Breo, Spiriva, begin Stiolto, Flovent 110. In one month begin Daliresp.  Asthma Education was provided, use of 24-hour on-call pager, treatment plan, device techniques were reviewed with patient.     Sherlie Ban, RRT, RCP, AE-C, CCM  Clinical Coordinator, ROAD (Reversible Obstructive Airways Disease) Program

## 2015-05-21 LAB — HEPATITIS B CORE AB, TOTAL: HEPATITIS B CORE AB, TOTAL: NONREACTIVE

## 2015-05-21 LAB — HEPATITIS C AB SCREEN: HEPATITIS C AB SCREEN: REACTIVE — AB

## 2015-05-22 LAB — HCV VIRAL LOAD: HCV VIRAL LOAD: 1936911 [IU]/mL

## 2015-05-22 LAB — ALPHA-1-ANTITRYPSIN: ALPHA-1-ANTITRYPSIN: 155 mg/dL (ref 79–207)

## 2015-05-23 LAB — POC SPIROMETRY
FEV1 % PRED: 25
FEV1: 0.55
FVC % PRED: 47
FVC: 1.48

## 2015-05-24 LAB — HCV GENOTYPE

## 2015-05-28 LAB — RAST GERSHWIN PANEL

## 2015-05-28 LAB — IMMUNOGLOBULIN E: IMMUNOGLOBULIN E: 86 kU/L — AB (ref <25)

## 2015-06-19 MED FILL — flu vaccine qs2016-17(36 mos up)(PF)60 mcg(15 mcgx4)/0.5 mL IM syringe: INTRAMUSCULAR | Qty: 0.5 | Status: AC

## 2015-06-19 NOTE — Progress Notes (Signed)
I have seen and examined the patient today (DOS 05-20-15) in [facility] with fellow Dr. Dennie Maizes. I have independently verified the subjective, objective, radiological, and laboratory data available. I agree with the jointly formulated plan of care as noted above.    Electronically signed by:  Judeth Horn Julaine Fusi, MD  Attending Physician  Pulmonary, Critical Care, and Sleep Medicine  PI# [id number] Pager# [phone]

## 2015-06-28 ENCOUNTER — Ambulatory Visit: Payer: BLUE CROSS/BLUE SHIELD | Attending: GASTROENTEROLOGY | Admitting: GASTROENTEROLOGY

## 2015-06-28 ENCOUNTER — Encounter: Payer: Self-pay | Admitting: GASTROENTEROLOGY

## 2015-06-28 VITALS — BP 157/91 | HR 118 | Temp 98.1°F | Ht 65.0 in | Wt 135.5 lb

## 2015-06-28 DIAGNOSIS — J449 Chronic obstructive pulmonary disease, unspecified: Secondary | ICD-10-CM | POA: Insufficient documentation

## 2015-06-28 DIAGNOSIS — B182 Chronic viral hepatitis C: Principal | ICD-10-CM | POA: Insufficient documentation

## 2015-06-28 NOTE — Patient Instructions (Signed)
   A [place] FIBROSCAN  has been ordered for you, we have taken the liberty of scheduling this appointment for you. please give Radiology a call at [phone] Dial 1 for scheduling

## 2015-06-28 NOTE — Progress Notes (Addendum)
Pharmacist Initial Visit  Clinical Pharmacy HCV Treatment Introduction    Sylvia Carter is a 59yr old female with HCV genotype 1a, treatment naive. Patient appears noncirrhotic per physical exam by physician. Patient to complete additional tests before treatment can be determined.    Significant PMH  Stage IV COPD  GERD    Educated Ms. Sylvia Carter, on the role of clinical pharmacist during her Hepatitis C therapy including facilitating medication access, managing side effects, monitoring lab values, ensuring medication adherence, and to answer any non-urgent questions relating to Hepatitis C treatment.    Educated patient on HCV modes of transmission and to avoid sharing needles, straws for intranasal drug use or razors/toothbrushes. Patient should also avoid tattoos that are not professionally done or having unprotected sex. Educated patient to avoid ETOH, smoking or other agents that may further damage the liver. Patient expressed understanding.    Home medications,OTC and herbal supplements reviewed. Drug-drug interactions screened and a potential interaction was found with Alka-Seltzer. Patient reports rarely taking Alka-Seltzer for GERD symptoms and denies using any other acid-suppressing agents. Educated the patient regarding the potential drug interactions with acid-suppressing agents and HCV regimens. The patient is okay with holding all acid-suppressing agents if needed during treatment.     Provided detailed counseling on medication dosing schedule, side effects, and lab due date. Patient lives [place] hours from [facility] and would like to have labs drawn locally. Patient expressed understanding.    Labs (n/a)  HepA surface antibody: ordered    HepB surface antibody: ordered  HepB surface antigen: ordered  HepB core antibody: NR (05/20/15)    HIV screen: ordered    Assessment: Sylvia Carter is a 59yr old female with HCV genotype 1a, treatment naive. Patient is noncirrhotic per physical exam by physician. Prefer a  single-tablet, well-tolerated, non-ribavirin regimen in this patient. The patient has a severe history of Stage IV COPD, is managed on multiple oral/inhaled COPD medications, has worsening pulmonary function tests, and is currently being considered for lung transplantation. Given the severity of her COPD, it is likely this patient will not tolerate a regimen on ribavirin and is considered to be ribavirin ineligible. Based on the AASLD recommendations, the preferred regimens for this patient include Harvoni x12 weeks, Epclusa x12 weeks, Zepatier +/- RBV x12-16 weeks (pending NS5A testing). The patient is motivated to start treatment. Once the patient completes baseline tests, it is appropriate to start treatment in this patient.    Plan:   1) Will discuss treatment options once fibroscan is completed.   2) Patient to complete fibroscan.  3) Instructed patient to call clinical pharmacist at [phone] or [phone] [option 2] for non-urgent questions.    Time spent counseling and educating patient: 15 minutes    Jerene Pitch, PharmD  PGY-1 Ambulatory Care Pharmacy Resident  Pager: [phone]      The Pharmacy resident and I have reviewed and discussed this patient together. I was available during the interview and agree with the documentation as noted.     Serita Kyle, PharmD  Hepatology/Infectious Diseases Clinical Pharmacist  [phone]

## 2015-06-28 NOTE — Nursing Note (Signed)
Patient is in room #1    Medication list given at the time of check in for review by patient, instructed patient to indicate to the doctor any corrections that need to be made for Medication Reconciliation.    Patient identifiers obtained. vital signs taken, screened for pain, allergies checked, patient roomed, and Christopher Lawson Bowlus, MD notified.     Sylvia Meinhardt, MA

## 2015-06-28 NOTE — Progress Notes (Signed)
OUTPATIENT HEPATOLOGY CLINIC CONSULTATION    NAME: Sylvia Carter  DOB: April 06, 1956    Chief Complaint: Chronic Hepatitis C ([place])     History of Present Illness:  Sylvia Carter is a 59 year old woman seen in consultation for Chronic Hepatitis C ([place]), genotype 1a, stage unknown, treatment naive. The patient reports being diagnosed 10 years ago when donating blood. She believes that her risk factor was a transfusion in 1979 for a miscarriage. She has never had a liver biopsy of evaluation and has never been treated. She no history of jaundice or other liver disease. There is no family history of liver disease.     The patient denies complaints of fluid retention, increasing abdominal girth, hematemesis, melena, hematochezia, altered mental status or weight loss.     Review of Systems   Constitutional: Positive for fatigue.   Respiratory: Positive for shortness of breath.    All other systems reviewed and are negative.    No Known Allergies      Outpatient Prescriptions Marked as Taking for the 06/28/15 encounter (Office Visit) with Melida Quitter, MD   Medication Sig Dispense Refill    Albuterol (PROAIR HFA, PROVENTIL HFA, VENTOLIN HFA) 90 mcg/actuation inhaler Take 1-2 puffs by inhalation every 4 hours if needed. 1 inhaler 6    Fluticasone (FLOVENT HFA) 110 mcg/actuation Inhaler Take 2 puffs by inhalation every 12 hours. 12 g 3    MISC ELECTROLYTE REPLACEMENTS (CALCIUM, [place]++,)       MULTIVIT WITH CALCIUM,IRON,MIN (WOMEN'S MULTIPLE VITAMINS PO)       Roflumilast (DALIRESP) 500 mcg Tablet Tablet Take 1 tablet by mouth every day. 30 tablet 11    tiotropium-olodaterol (STIOLTO RESPIMAT) 2.5-2.5 mcg/actuation Mist Take 2 puffs by inhalation every day. 1 g 11      Past Medical History   Diagnosis Date    COPD (chronic obstructive pulmonary disease)     Hepatitis C        Past Surgical History   Procedure Laterality Date    No surgical history        Family History   Problem Relation Age of Onset    type 1  diabetes [OTHER] Mother     Diabetes Father     emphysema [OTHER] Maternal Grandfather      Social History     Social History    Marital status: MARRIED     Spouse name: N/A    Number of children: N/A    Years of education: N/A     Social History Main Topics    Smoking status: Former Smoker    Smokeless tobacco: None    Alcohol use None      Comment: occasional    Drug use: None    Sexual activity: Not Asked     Other Topics Concern    None     Social History Narrative     Physical Exam   Constitutional: She is oriented to person, place, and time and well-developed, well-nourished, and in no distress. No distress.   HENT:   Head: Normocephalic and atraumatic.   Eyes: Conjunctivae are normal. No scleral icterus.   Neck: Neck supple. No thyromegaly present.   Cardiovascular: Normal rate and regular rhythm.    No murmur heard.  Pulmonary/Chest: She has decreased breath sounds.   Mild dyspnea on nasal cannula   Abdominal: Soft. She exhibits no distension and no mass. There is no splenomegaly or hepatomegaly. There is no tenderness. There is no rebound  and no guarding.       Musculoskeletal: She exhibits no edema, tenderness or deformity.   Lymphadenopathy:     She has no cervical adenopathy.   Neurological: She is alert and oriented to person, place, and time.   Skin: Skin is warm and dry. No rash noted. She is not diaphoretic. No erythema. No pallor.       Lab Results   Lab Name Value Date/Time    WBC 6.3 05/20/2015 01:14 PM    HGB 14.6 05/20/2015 01:14 PM    HCT 43.7 05/20/2015 01:14 PM    PLT 274 05/20/2015 01:14 PM     Lab Results   Lab Name Value Date/Time    NA 139 05/20/2015 01:14 PM    K 3.9 05/20/2015 01:14 PM    CL 100 05/20/2015 01:14 PM    CO2 31 05/20/2015 01:14 PM    BUN 12 05/20/2015 01:14 PM    CR 0.58 05/20/2015 01:14 PM    GLU 94 05/20/2015 01:14 PM     Lab Results   Lab Name Value Date/Time    AST 35 05/20/2015 01:14 PM    ALT 36 05/20/2015 01:14 PM    ALP 36 05/20/2015 01:14 PM    ALB 4.1  05/20/2015 01:14 PM    TP 7.6 05/20/2015 01:14 PM    TBIL 0.6 05/20/2015 01:14 PM      Results for Sylvia Carter, Sylvia Carter (MRN [id number]) as of 06/28/2015 18:33   Ref. Range 05/20/2015 13:14   HEPATITIS B CORE Ab, TOTAL Latest Ref Range: Nonreactive  Nonreactive   HEPATITIS C Ab SCREEN Latest Ref Range: Nonreactive  REACTIVE (Abnl)   HCV GENOTYPE Unknown Type 1a   HCV VIRAL LOAD Latest Units: IU/mL [id number]     Assessment and Plan: 59 year old woman with COPD and CHC, genotype 1a, treatment naive, non-cirrhotic on clinical exam.     Ideally she should be treated with Harvoni for 8 weeks if she is confirmed to be non-cirrhotic. She should not be treated with a ribavirin-containing regimen due to her COPD.     We will get a FibroScan to confirm that she does not have cirrhosis and begin the authorization process for treatment.  Will also confirm status of HAV, HBV, and HIV and recommend that she be vaccinated accordingly.     Thank you for allowing me to participate in the care of this patient. Please do not hesitate to contact me if you have any questions.    Sincerely,        Lugene Hitt L. Devion Chriscoe, MD  Professor     Orders Placed This Encounter    [place] Fibroscan    HEPATITIS A AB, IGG    HEPATITIS B SURFACE ANTIGEN    HEPATITIS B SURFACE ANTIBODY    MULTIVIT WITH CALCIUM,IRON,MIN (WOMEN'S MULTIPLE VITAMINS PO)    MISC ELECTROLYTE REPLACEMENTS (CALCIUM, [place]++,)

## 2015-07-10 ENCOUNTER — Other Ambulatory Visit: Payer: Self-pay

## 2015-07-10 ENCOUNTER — Telehealth: Payer: Self-pay | Admitting: Pharmacist

## 2015-07-10 DIAGNOSIS — B1920 Unspecified viral hepatitis C without hepatic coma: Principal | ICD-10-CM

## 2015-07-10 MED ORDER — SOFOSBUVIR 400 MG-VELPATASVIR 100 MG TABLET
1.0000 | ORAL_TABLET | Freq: Every day | ORAL | 2 refills | Status: DC
Start: 2015-07-10 — End: 2015-07-24
  Filled 2015-07-10: qty 28, 28d supply, fill #0

## 2015-07-10 NOTE — Telephone Encounter (Signed)
Pharmacist Telephone Visit  Clinical Pharmacy HCV Treatment Planning    Sylvia Carter is a 59yr old female with HCV genotype 1a, treatment naive. Patient non-cirrhotic based on FIB-4 score 1.28, APRI score 0.297 and physical exam (clinic visit 06/28/15). Patient prescribed Epclusa (SOF/VEL) for 12 weeks. Last seen in clinic on 06/28/15.    Significant PMH  Stage IV COPD --> undergoing evaluation for lung transplant    Notified by [facility] that patient has been experiencing difficulty with scheduling her lung CT and fibroscan. Patient lives 2[place]5 hrs away and would like to get both scheduled on the same day. She is still awaiting auth for her lung CT, so unclear when she will be able to schedule fibroscan. Called patient to discuss further. She denies any changes in her health or medications since last seen in clinic. She is very motivated to start treatment as soon as possible.     Labs --> patient states that these were done several years ago, she will attempt to obtain records  HepA surface antibody    HepB surface antibody  HepB surface antigen  HepB core antibody    HIV screen    Assessment: 59yr old female with HCV genotype 1a, treatment naive. Patient non-cirrhotic based on FIB-4 score 1.28, APRI score 0.297 and physical exam (clinic visit 06/28/15). Patient is not a candidate for a RBV-containing regimen d/t significant lung disease: COPD stage IV and currently undergoing evaluation for lung transplant. Prefer to treat patient prior to lung transplant to avoid any possible complications s/p lung transplant and immunosuppression. Possible RBV-free regimens include Harvoni x12 weeks, Epclusa x12 weeks, Zepatier x 12 wks provided no NS5A RAVs. Treatment duration is the same for non-cirrhotic vs compensated cirrhotic patients for the aforementioned regimens. Recommend patient still proceed with scheduling Fibroscan; however, will proceed with Epclusa (SOF/VEL) for 12 weeks at this time.      Plan:   1) Epclusa (SOF/VEL)  ordered for 12 weeks. Will begin the prior authorization process   2) Patient to complete baseline labs or provide records for HepA/B serologies and HIV screen and complete fibroscan  3) Instructed patient to call clinical pharmacist at [phone] or [phone] [option 2] for non-urgent questions.      Serita Kyle, PharmD  Hepatology/Infectious Diseases Clinical Pharmacist  [phone]        Time spent counseling and educating patient: 10 minutes

## 2015-07-11 ENCOUNTER — Encounter: Payer: Self-pay | Admitting: GASTROENTEROLOGY

## 2015-07-12 DIAGNOSIS — Z79899 Other long term (current) drug therapy: Secondary | ICD-10-CM | POA: Insufficient documentation

## 2015-07-17 ENCOUNTER — Other Ambulatory Visit: Payer: Self-pay | Admitting: PULMONARY DISEASE

## 2015-07-17 DIAGNOSIS — J449 Chronic obstructive pulmonary disease, unspecified: Principal | ICD-10-CM

## 2015-07-22 ENCOUNTER — Encounter: Payer: Self-pay | Admitting: PULMONARY DISEASE

## 2015-07-22 ENCOUNTER — Telehealth: Payer: Self-pay | Admitting: GASTROENTEROLOGY

## 2015-07-22 DIAGNOSIS — B1920 Unspecified viral hepatitis C without hepatic coma: Principal | ICD-10-CM

## 2015-07-22 NOTE — Telephone Encounter (Signed)
Patient is calling in regards to the following medication being denied sofosbuvir-velpatasvir (EPCLUSA) 400-100 mg Tablet. Patient states she has her [place] FIBROSCAN scheduled and is requesting a call from dr. Marlowe Sax as soon as possible. Please contact patient at [phone].

## 2015-07-23 ENCOUNTER — Ambulatory Visit: Payer: BLUE CROSS/BLUE SHIELD

## 2015-07-24 MED ORDER — LEDIPASVIR 90 MG-SOFOSBUVIR 400 MG TABLET
1.0000 | ORAL_TABLET | Freq: Every day | ORAL | 2 refills | Status: DC
Start: 2015-07-24 — End: 2015-10-08
  Filled 2015-07-24: qty 28, 28d supply, fill #0

## 2015-07-24 NOTE — Telephone Encounter (Signed)
Returned patient's call. Sylvia Carter has been denied by insurance d/t formulary restrictions. Harvoni or Zepatier are preferred by insurance. Patient is appropriate for either Harvoni or Epclusa x 12 wks. Will proceed with Harvoni x 12 wks and begin prior authorization process.    Serita Kyle, PharmD  Hepatology/Infectious Diseases Clinical Pharmacist  [phone]

## 2015-07-25 ENCOUNTER — Ambulatory Visit (HOSPITAL_BASED_OUTPATIENT_CLINIC_OR_DEPARTMENT_OTHER)
Admission: RE | Admit: 2015-07-25 | Discharge: 2015-07-25 | Disposition: A | Discharge status: Home / Self Care | Payer: BLUE CROSS/BLUE SHIELD | Source: Ambulatory Visit | Attending: PULMONARY DISEASE | Admitting: PULMONARY DISEASE

## 2015-07-25 ENCOUNTER — Ambulatory Visit
Admission: RE | Admit: 2015-07-25 | Discharge: 2015-07-25 | Disposition: A | Discharge status: Home / Self Care | Payer: BLUE CROSS/BLUE SHIELD | Source: Ambulatory Visit | Attending: GASTROENTEROLOGY | Admitting: GASTROENTEROLOGY

## 2015-07-25 DIAGNOSIS — B182 Chronic viral hepatitis C: Principal | ICD-10-CM | POA: Insufficient documentation

## 2015-07-25 DIAGNOSIS — J432 Centrilobular emphysema: Secondary | ICD-10-CM

## 2015-07-25 DIAGNOSIS — R918 Other nonspecific abnormal finding of lung field: Secondary | ICD-10-CM

## 2015-08-21 ENCOUNTER — Telehealth: Payer: Self-pay | Admitting: PULMONARY DISEASE

## 2015-08-21 DIAGNOSIS — J9859 Other diseases of mediastinum, not elsewhere classified: Secondary | ICD-10-CM | POA: Insufficient documentation

## 2015-08-21 NOTE — Telephone Encounter (Signed)
I spoke to the patient about her Chest CT results from 12/15. I explained the next steps including PET/CT and possibly diagnostic biopsy with CT surgery. She understood and was satisfied with the answers and plan moving forward.     IMPRESSION:  1. 2.6 cm heterogeneous, lobulated anterior mediastinal mass.  Differential includes lymphoma, thymoma, or metastasis. Not favored to  represent thyroid cancer or teratoma based on the appearance. Recommend  further evaluation with PET and/or biopsy and consultation with surgery.   2. Severe centrilobular emphysema.    Sylvia Carter A. Julaine Fusi, MD

## 2015-08-22 ENCOUNTER — Telehealth: Payer: Self-pay | Admitting: PULMONARY DISEASE

## 2015-08-22 NOTE — Telephone Encounter (Signed)
Called pt to sch procedure.  Transferred pt to RAD to be sch.  [place] [id number]

## 2015-08-30 ENCOUNTER — Ambulatory Visit
Admission: RE | Admit: 2015-08-30 | Discharge: 2015-08-30 | Disposition: A | Discharge status: Home / Self Care | Payer: BLUE CROSS/BLUE SHIELD | Source: Ambulatory Visit | Attending: Nuclear Medicine | Admitting: Nuclear Medicine

## 2015-08-30 DIAGNOSIS — D383 Neoplasm of uncertain behavior of mediastinum: Principal | ICD-10-CM | POA: Insufficient documentation

## 2015-08-30 DIAGNOSIS — J9859 Other diseases of mediastinum, not elsewhere classified: Secondary | ICD-10-CM | POA: Insufficient documentation

## 2015-09-02 ENCOUNTER — Encounter: Payer: Self-pay | Admitting: PULMONARY DISEASE

## 2015-09-02 ENCOUNTER — Ambulatory Visit: Payer: BLUE CROSS/BLUE SHIELD | Attending: PULMONARY DISEASE | Admitting: PULMONARY DISEASE

## 2015-09-02 VITALS — BP 140/96 | HR 90 | Temp 97.4°F | Resp 16 | Ht 65.0 in | Wt 136.4 lb

## 2015-09-02 DIAGNOSIS — J449 Chronic obstructive pulmonary disease, unspecified: Secondary | ICD-10-CM | POA: Insufficient documentation

## 2015-09-02 DIAGNOSIS — J9859 Other diseases of mediastinum, not elsewhere classified: Principal | ICD-10-CM | POA: Insufficient documentation

## 2015-09-02 NOTE — Nursing Note (Signed)
Patient was verified with 2 identifiers, vital signs taken, allergies verified, and screened for pain.  Patient comes in ambulatory.    Patient is in RM# 2    Patient's DME company is [employer].      Lovie Chol MA

## 2015-10-02 NOTE — Progress Notes (Signed)
UCAN note (DOS 09-02-15) has been dictated.     Rebekah Chesterfield, MD

## 2015-10-03 NOTE — Progress Notes (Signed)
RE:  Sylvia Carter, Sylvia Carter  MR#:  [id number]  DOB:  06-09-1956  Date of Service:  09/02/2015        Dear Referring Physician:    As you know, Sylvia Carter is a pleasant, 60 year old woman with history  of severe emphysema, possible asthma-COPD overlap, GERD, and history  of hepatitis C, who is following up today after Sylvia screening chest CT  scan to evaluate both Sylvia lung anatomy for COPD, emphysema, as well as  a screening, given that she is a former, greater than 30 pack-year  heavy smoker.    As you may recall, the CT done July 25, 2015, showed evidence of a  2.6 cm heterogeneous lobulated anterior mediastinal mass with a  differential that included lymphoma, thymoma, or metastasis.  As read  by the radiologist, this was not favored to represent thyroid cancer  or teratoma, based on the appearance.  A PET-CT was then ordered and  done on August 30, 2015, which showed that the anterior mediastinal  mass demonstrated low-level FDG activity, and no evidence of distant  metastatic disease outside of the thorax.    Upon review of the patient's scanned records in the computer,  unfortunately, I do not have the CD images to review myself.  The  patient did have a CT scan done January 15, 2012 and, in that report,  there was a mention of a 1.5 x 1.7 cm nodule in the mid-anterior  mediastinal fat and a right hilar 2 cm lymph node was noted.  The  other findings included severe emphysema, lingular and right upper  lobe technically indeterminate nodules thought to be infectious or  scarring.  However, malignancy not excluded, and a PET scan was  recommended.  There was also mention of mediastinal and hilar  adenopathy of uncertain etiology, small pericardial effusion, dilated  pulmonary arteries.  The lingular nodule is 8 mm x 19 mm (1.9 cm), and  the right upper lobe lung nodule is 6 mm.    Then, a PET scan was done on February 02, 2012, per the records, and  showed severe emphysema with near-total resolution of the inferior  lingular  opacity thought to reflect resolving infectious process.  There was no hypermetabolic lesions to suggest malignancy and no  hypermetabolic activity in the chest.  Again, this is by report.  I do  not have the images to review myself.    All of these findings were shared with the patient, both by phone  calls previously, as well as in clinic, and images were reviewed.    According to the patient, she has been doing relatively well.  She  initially felt dizzy with using Daliresp but has been using it once  every 3 days and tolerating that frequency.  She has not needed to use  prednisone or Z-Pak as part of Sylvia action plan.    In terms of our laboratory workup, the patient had a RAST panel, which  was negative.  IgE previously was ? in 2013 and currently is 42, which  is mild to moderately elevated.  Chemistry is overall normal.  Bicarbonate 31, creatinine 0.58.  Normal LFTs.  Alpha 1 antitrypsin is  155, which is within the reference range and normal.  A C-reactive  protein is less than 0.1.  Sylvia INR is 1.01.  Sylvia CBC shows normal  white count of 6.3, hematocrit 43.7, platelets 274.  Absolute  eosinophil count of 300.  Sylvia hepatitis B core antigen is nonreactive.  Sylvia hepatitis C antibody screen is reactive. She has a type 1 A  genotype and a higher viral load of 1,936,9-1-1.  This was done  May 20, 2015.    The patient's PFTs were as noted in the previous charting, as noted.    Review Of Systems:  Otherwise, completely negative, except for that  mentioned above.    Physical Examination:  On physical examination, temperature 97.4,  blood pressure 140/96, respiratory rate 16, pulse 90, oxygen  saturation 96% on 2 L.  Height is 5 feet 5 inches tall, weighs 136.4  pounds.  In general, patient is in no acute distress.  She is  accompanied by Sylvia Carter.  Lungs are clear to auscultation  bilaterally but mildly distant breath sounds.  Heart is regular rate  and rhythm.  No murmurs, rubs, or gallops.  Abdomen:   Positive bowel  sounds, nontender, nondistended, slender.  She is on nasal cannula O2  in clinic.  She has no clubbing, cyanosis, or edema of the lower  extremities.    Medications:  Medications include albuterol; Z-Pak action plan;  Flovent inhaler 110 mcg, 2 puffs every 12 hours; Harvoni, which is  combination Ledipasvir and Sofosbuvir; multivitamins; prednisone  action plan; Daliresp 500 mcg, 1 tablet a day; Stiolto Respimat, which  is composed of Tiotropium and olodaterol 2 puffs every day.    Assessment/plan:  1) COPD versus asthma-COPD overlap;    2) GERD;    3) Hepatitis C viral infection;    4) Anterior mediastinal mass.    Regarding Sylvia COPD, she appears to be relatively stable with the above  workup.  She is to continue Sylvia Flovent and Stiolto, as mentioned.  I  have recommended that she consider increasing Daliresp from every  other day to once a day, if tolerated.  I have also referred Sylvia for  pulmonary rehabilitation.  Sylvia GERD also appears to be well stable.    Regarding the more concerning problem is the anterior mediastinal  mass, which as mentioned above, appears to have grown from 1.5 x 1.7  cm in 2013 to the current size of 2.6 cm, if this is indeed in the  same area and the same lesion.  This is presumed based on the reading,  as per above.  However, I do not have the images to compare directly,  and I have asked the patient to bring in a CD of those PET and CT  scans done in June of 2013, as mentioned above.  I had a long  discussion with the patient and Sylvia Carter regarding the next steps.  I recommended that, if indeed this has grown in size, that she be  referred to see Cardiothoracic Surgery.  However, they were not sure  if that if they were willing to do this quite yet, given the findings.  We discussed at length the next steps and, while they are a little bit  hesitant and unsure, we decided that once they provide me with the  CDs, I will be able to review them carefully and make  further  recommendations.  Upon further thinking and discussion, if indeed this  is the same lesion that has grown in size, then a referral for  mediastinal biopsy and mediastinoscopy is certainly indicated, based  on the trajectory of this.  I placed a referral for the patient to see  Cardiothoracic Surgery and informed Mrs Mckenny, as well, in order to at  least get their opinion, and  I have instructed Sylvia to bring the CDs of  the PET and CT scan to show them, as well, and to bring the CDs to me  for additional review.  I feel this is the best course of action to  address the enlarging nodule, although by appearances, this appears to  be a slowly growing and will hopefully not be a serious issue for the  patient. But given the differential diagnosis and the anatomical  location, I feel that seeing the Cardiothoracic surgeons sooner rather  than later is warranted.  This has been communicated to the patient.  All of Sylvia questions (and Sylvia Carter's) were answered to their  satisfaction.    Thank you for referring me this patient.  Please do not hesitate to  contact me with any questions.    Sincerely,    Mackenzye Mackel A. Julaine Fusi, MD        Report Electronically Signed - 11/04/2015 06:14:38 by  Hubbard Hartshorn, MD  Assistant Professor Of Medicine  Internal Medicine  Pulmonary And Critical Care Medicine    AAZ/co  D:  10/02/2015 07:23:06 PDT/PST  T:  10/03/2015 15:04:51 PDT/PST  Job #:  [id number] / [id number]

## 2015-10-04 ENCOUNTER — Encounter: Payer: Self-pay | Admitting: Surgery

## 2015-10-04 ENCOUNTER — Ambulatory Visit: Payer: BLUE CROSS/BLUE SHIELD | Attending: Surgery | Admitting: Surgery

## 2015-10-04 VITALS — BP 126/83 | HR 95 | Temp 98.6°F | Resp 16 | Ht 65.35 in | Wt 136.4 lb

## 2015-10-04 DIAGNOSIS — J9859 Other diseases of mediastinum, not elsewhere classified: Principal | ICD-10-CM | POA: Insufficient documentation

## 2015-10-04 NOTE — Nursing Note (Signed)
Switched patient over to Clinic oxygen tank at flow 2.     Darvin Neighbours, MA l

## 2015-10-04 NOTE — Progress Notes (Signed)
[facility]  NEW PATIENT VISIT      Hubbard Hartshorn, MD  [facility]  [place], [place] [address]   [place]     Regarding: Sylvia Carter  MR # [id number]   DOB May 20, 1956   DOS 10/04/2015     Dear Dr. Julaine Fusi,    Today we saw your patient Ms. Sylvia Carter in our Thoracic Surgery Clinic. As you know she is a 60 year old female with a history of COPD and imaging with incidental finding of anterior mediastinal mass.  Sylvia Carter has been followed by pulmonary for her COPD and recent routine CT chest was obtained showing incidental anterior mediastinal mass.  She has also obtained PET scan.  She is here today for further evaluation.    Sylvia Carter has no cough or hemoptysis.  She rarely has shortness of breath, only sometimes following activity.  She has been on oxygen for about 3 years, but only used it intermittently at the beginning. She now uses the oxygen more often, usually 2 L at rest and 3L with activity.  She thinks she has gained some weight recently, but is unsure how much.  She has fatigue. She has no chest pain.    PMH reviewed below; she reports rheumatic fever as a child and a heart murmur, but is not followed by a cardiologist. She has been approved for harvoni treatment for Hep C, but has not started treatment.  No PSH. Family history reviewed. She quit smoking 3.5 years ago, but smoked 1/2 ppd for 25-30 years.   She lives in [place].  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    History:  Past Medical History   Diagnosis Date    COPD (chronic obstructive pulmonary disease)     Hepatitis C        Past Surgical History   Procedure Laterality Date    No surgical history         Current Outpatient Prescriptions   Medication Sig Dispense Refill    Albuterol (PROAIR HFA, PROVENTIL HFA, VENTOLIN HFA) 90 mcg/actuation inhaler Take 1-2 puffs by inhalation every 4 hours if needed. 1 inhaler 6    Azithromycin (ZITHROMAX Z-PAK) 250 mg  Tablet Take 2 tabs orally on first day, then one daily until gone (Patient not taking: Reported on 06/28/2015           ) 6 tablet 0    Budesonide/Formoterol (SYMBICORT) 160-4.5 mcg/actuation HFA Aerosol Inhaler Inhaler Take 2 puffs by inhalation 2 times daily. (Patient not taking: Reported on 06/28/2015           ) 1 inhaler 3    Fluticasone (FLOVENT HFA) 110 mcg/actuation Inhaler Take 2 puffs by inhalation every 12 hours. 12 g 3    Ledipasivir 90 mg/Sofosbuvir 400 mg (HARVONI) 90-400 mg Tablet Take 1 tablet by mouth every day. 28 tablet 2    MISC ELECTROLYTE REPLACEMENTS (CALCIUM, Adair++,)       MULTIVIT WITH CALCIUM,IRON,MIN (WOMEN'S MULTIPLE VITAMINS PO)       PredniSONE (DELTASONE) 20 mg Tablet Take 1 tablet by mouth every day. To be taken as instructed by MD in case of pulmonary exacerbation 30 tablet 0    Roflumilast (DALIRESP) 500 mcg Tablet Tablet Take 1 tablet by mouth every day. 30 tablet 11    Tiotropium (SPIRIVA WITH HANDIHALER) 18 mcg Capsule (MUST use with HandiHaler Device) Take  1 capsule by inhalation every day. Inhale the contents of one capsule in the handihaler once daily.  Do NOT swallow capsule. (Patient not taking: Reported on 06/28/2015           ) 30 capsule 11    tiotropium-olodaterol (STIOLTO RESPIMAT) 2.5-2.5 mcg/actuation Mist Take 2 puffs by inhalation every day. 1 g 11     No current facility-administered medications for this visit.        No Known Allergies    History   Exposure to Toxins, Irritants or Carcinogenics   none      Social History     Social History    Marital status: MARRIED     Spouse name: N/A    Number of children: N/A    Years of education: N/A     Occupational History    Not on file.     Social History Main Topics    Smoking status: Former Smoker     Packs/day: 0.50     Years: 20.00     Types: Cigarettes     Quit date: 03/02/2012    Smokeless tobacco: Never Used    Alcohol use Not on file      Comment: occasional    Drug use: Not on file    Sexual  activity: Not on file       Living Arrangements   Sylvia Carter lives with her family.        Family History   Problem Relation Age of Onset    type 1 diabetes [OTHER] Mother     Diabetes Father      MI in 24s    emphysema [OTHER] Maternal Grandfather     Other [OTHER] Maternal Grandmother      possible heart failure         Review of Systems:  Constitutional: fatigue.  Weight has been increasing, unknown amount (stable by recent vitals)  Shortness of Breath: only intermittent with activity  Hemoptysis: Has hemoptysis never  Head Ache: Has headache occasional  Change in voice: no  Dysphagia: No  Odynophagia: no  All systems negative except as noted in HPI.    Physical Examination:   Vitals:    10/04/15 0949   BP: 126/83   Pulse: 95   Resp: 16   Temp: 37 C (98.6 F)   TempSrc: Oral   SpO2: 99%   Weight: 61.9 kg (136 lb 6.4 oz)   Height: 1.66 m (5' 5.35")      Body mass index is 22.45 kg/(m^2).   General: well developed well nourished F, no distress; O2 via NC  Eyes:  PERRL  Ears, Nose, Mouth, Throat:  Moist mucosa  CV:  RRR no murmur  Respiratory:  CTAB but distant  GI: soft NTTP with +BS  Extremities:  No LE edema  Musculoskeletal: no weakness  Lymph: no lymphadenopathy  Supraclavicular adenopathy: no  Neurologic:  No focal deficits  Skin: no bruise or rash      Performance Status (ZUBROD) Score: 1 - Symptomatic but completely ambulatory    Imaging Studies reviewed by Dr. Marsa Aris:  CT chest  07/25/15  IMPRESSION:  1. 2.6 cm heterogeneous, lobulated anterior mediastinal mass.  Differential includes lymphoma, thymoma, or metastasis. Not favored to  represent thyroid cancer or teratoma based on the appearance. Recommend  further evaluation with PET and/or biopsy and consultation with surgery.     2. Severe centrilobular emphysema.      PET  08/30/15  IMPRESSION:  1. Anterior mediastinal mass demonstrates low level FDG activity.    2. No evidence of distant metastatic disease outside of the thorax.      PFT:    04/20/14  FEV1  23%  DLCO  41%    Clinical stage: n/a    Assessment/plan:   Sylvia Carter is a 60 year old female with a history of COPD and imaging with incidental finding of anterior mediastinal mass.  We had a lengthy discussion with Sylvia Carter and her husband.  Recommendation would usually be surgical resection via VATS, however her pulmonary function may be prohibitive for single lung ventilation and she may require sternotomy.  We will discuss her case at our multidisciplinary Thoracic Tumor Board for recommendations.  We will see her back on 11/01/15 for further discussion.  In the interim, we will try to obtain her previous imaging for comparison.    This patient was seen and evaluated under the supervision of Dr. Marsa Aris.    Thank you.    Face-to-face time of this new patient visit is 50 minutes. Greater than 70 % of the time was spent discussing the patient's disease, prognosis, symptoms, and treatment plan.    Jacalyn Lefevre, PA-C  Section of General Thoracic Surgery  [facility]  [place], [place] [address]  Phone: [phone]  Fax: [phone]

## 2015-10-04 NOTE — Nursing Note (Signed)
Verified patient with 2 identifiers, vitals taken, screen for pain, pharmacy verified, allergies verified    Ares Cardozo, MA     Reviewed with patient if any new or worsening emotional or social concerns.     No- No action needed.

## 2015-10-05 NOTE — Progress Notes (Signed)
The patient was seen by the physician assistant and myself, and the exam and the clinical plan was formulated under my supervision.  Bassel Gaskill Tom Athel Merriweather, MD, FCCP, FACS  Associate Professor  Head, Section of General Thoracic Surgery  [place], [facility]  [address]  [place], [place] [address]  Phone: [phone]  Fax: [phone]  Email: [email or web address]

## 2015-10-07 ENCOUNTER — Telehealth: Payer: Self-pay | Admitting: GASTROENTEROLOGY

## 2015-10-07 ENCOUNTER — Encounter: Payer: Self-pay | Admitting: PULMONARY DISEASE

## 2015-10-07 NOTE — Telephone Encounter (Signed)
These are Hep C meds - so will forward to the [place] team to follow up with pt. Thanks  Sharlot Gowda, RN

## 2015-10-07 NOTE — Telephone Encounter (Signed)
Patient calling stating prescription was approved and is requesting order be place. Phone number is [phone] fax [phone] for medication Ledipasivir 90 mg/Sofosbuvir 400 mg (HARVONI) 90-400 mg Tablet. Patient is requesting a call today and can be reached at [phone]

## 2015-10-07 NOTE — Telephone Encounter (Signed)
Patient calling again stating she has a different phone number to call in order [phone] to call in order. Must call and patient can get medication next day.

## 2015-10-08 ENCOUNTER — Telehealth: Payer: Self-pay

## 2015-10-08 DIAGNOSIS — B1920 Unspecified viral hepatitis C without hepatic coma: Principal | ICD-10-CM

## 2015-10-08 MED ORDER — LEDIPASVIR 90 MG-SOFOSBUVIR 400 MG TABLET
1.0000 | ORAL_TABLET | Freq: Every day | ORAL | 2 refills | Status: DC
Start: 2015-10-08 — End: 2016-01-16

## 2015-10-08 NOTE — Telephone Encounter (Signed)
Clinical Pharmacist Telephone Visit    HCV Treatment Initiation    Sylvia Carter is a 60yr old female with HCV genotype 1a, treatment naive. Patient non-cirrhotic based on FIB-4 score 1.28, APRI score 0.297 and physical exam (clinic visit 06/28/15). Patient prescribed Harvoni (SOF/LDV) for 12 weeks. Last seen in clinic on 06/28/15.    Significant PMH  Stage IV COPD --> undergoing evaluation for lung transplant    Educated Ms. Sylvia Carter, on the role of clinical pharmacist during her Hepatitis C therapy including facilitating medication access, managing side effects, monitoring lab values, ensuring medication adherence, and to answer any non-urgent questions relating to Hepatitis C treatment.     Home medications,OTC and herbal supplements reviewed. Drug-drug interactions screened and no significant interactions were identified. Pt plans to not take multivitamins, calcium, supplement, or ranitidine while on treatment.     Provided detailed counseling on medication dosing schedule, side effects, and lab due date.  Patient expressed understanding.      Assessment: 60yr old female with HCV genotype 1a, treatment naive. Patient non-cirrhotic based on FIB-4 score 1.28, APRI score 0.297 and physical exam (clinic visit 06/28/15). Patient is not a candidate for a RBV-containing regimen d/t significant lung disease: COPD stage IV and currently undergoing evaluation for lung transplant. Prefer to treat patient prior to lung transplant to avoid any possible complications s/p lung transplant and immunosuppression. Patient has been prescribed Harvoni x 12 weeks and is ready to start treatment.  Plan:   1) Initiate tx with Harvoni x 12 weeks on 10/14/15. Medications routed to [place] [facility] per insurance restrictions.  2) Instructed patient to return to lab 4 weeks after starting treatment (~11/11/15) for CMP, HCV VL  3) Instructed patient to call clinical pharmacist at [phone] or [phone] [option 2] for non-urgent  questions.      Olam Idler, PharmD  Hepatology/Infectious Diseases Clinical Pharmacist  [phone]      Time spent counseling and educating patient: 20 minutes

## 2015-10-15 NOTE — Telephone Encounter (Signed)
Pharmacy Technician Telephone Encounter  HCV treatment start date confirmation    Called patient to confirm HCV treatment start date.  Pharmacist consult offered. Patient declined. Pt had no questions for the pharmacist at this time. Reminded patient of first lab date, 11/11/15.     HCV Treatment Dates  Start: 10/14/15  End: 01/06/16    [facility]  Main line: [phone] opt 2  F: [phone]

## 2015-10-16 ENCOUNTER — Telehealth: Payer: Self-pay | Admitting: PULMONARY DISEASE

## 2015-10-16 ENCOUNTER — Encounter: Payer: Self-pay | Admitting: GASTROENTEROLOGY

## 2015-10-16 NOTE — Telephone Encounter (Signed)
Received CD from [facility] on 10/16/15. CD contains CT Chest and CXR DOS: 01/11/12, 01/15/12. Uploaded and archived CD. Returned to patient via mail  Lovie Chol, MA

## 2015-10-17 ENCOUNTER — Other Ambulatory Visit: Payer: Self-pay | Admitting: PULMONARY DISEASE

## 2015-10-18 ENCOUNTER — Encounter: Payer: Self-pay | Admitting: Surgery

## 2015-10-18 NOTE — Progress Notes (Unsigned)
Received CD from [facility] on 10/18/15. CD contains CT CHest. DOS 01/15/12. Uploaded and archived CD. Returned to [employer].                CXR  DOS  01/11/12  Annice Pih, MA II

## 2015-10-25 ENCOUNTER — Encounter: Payer: Self-pay | Admitting: Surgery

## 2015-10-28 ENCOUNTER — Other Ambulatory Visit: Payer: Self-pay | Admitting: Surgery

## 2015-11-01 ENCOUNTER — Ambulatory Visit: Payer: BLUE CROSS/BLUE SHIELD | Admitting: Surgery

## 2015-11-14 ENCOUNTER — Telehealth: Payer: Self-pay

## 2015-11-14 NOTE — Telephone Encounter (Addendum)
Clinical Pharmacist Telephone Visit    HCV Treatment Follow Up    Sylvia Carter is a 59yr old female with HCV genotype 1a, treatment naive. Patient non-cirrhotic based on FIB-4 score 1.28, APRI score 0.297 and physical exam (clinic visit 06/28/15). Fibroscan 4.2 kPa c/w F0-F1 on 07/25/2015. Patient is on Harvoni (SOF/LDV) week 5 of 12.  Last seen in clinic on 06/28/15.    Significant PMH  Stage IV COPD --> undergoing evaluation for lung transplant    HCV Treatment Dates  Start Date: 10/14/2015  End Date:  01/06/2016      S/O: Called patient to discuss lab results and follow-up on the progress of the treatment. Patient reports she is doing well, but states currently she has a minor cold/allergy and seeing her  PCP today. She reports she is tolerating Harvoni well with the exception of feeling nauseated at times. She has not missed any doses.    Week of tx BL Start date 4   Lab scheduled  10/14/2015 11/11/2015   Lab completed 05/20/2015  11/12/2015   Weight (kg)      WBC (4.5-11) 6.3     Hgb (12-16g/dL) 14.6     Hct (36-46%) 43.7     Plts (130-400) 274     Potassium 3.9  4.1   SCr(0.5-1.2) 0.58  0.6   EGFR  >60     Albumin 4.1  3.8   AST (15-43) 35  20   T.bili (0.3-1.3) 0.6  0.3   ALT (5-54) 36  20   HCV viral load 1,936,911  Detected         Assessment: Sylvia Carter is a 59yr old female with HCV genotype 1a, treatment naive.  On Harvoni (SOF/LDV)  week 4 of 12. HCV viral load pending. All other labs are stable. Patient has perfect adherence with treatment but has had mild nausea while on treatment. Medication list reviewed. No DDI detected at this time. No new medications reported since last clinic visit.        Plan:   1) Will f/u with VL results form 11/12/2015  2) Continue on Harvoni for 12 weeks  3) Next labs due 01/06/2016: CMP, VL  4) Instructed patient to call clinical pharmacist at [phone] or [phone] [option 2] for non-urgent questions.          Sylvia Carter, Pharm.D.,  BCPS  Hepatology/Infectious Diseases Clinical Pharmacist  [phone] option 2        Time spent counseling and educating patient: 15 minutes

## 2015-11-15 ENCOUNTER — Telehealth: Payer: Self-pay

## 2015-11-15 ENCOUNTER — Other Ambulatory Visit: Payer: Self-pay | Admitting: Internal Medicine

## 2015-11-15 NOTE — Telephone Encounter (Signed)
Rc'd call from daughter saying pt is having severe SOB, nearly went to ED last night. Called pt who is speaking in full sentences and reports she had increased SOB, PC of lt green sputum. Started Zpak last night, no rescue drug use, saying she does not like to use albuterol. Increased O2 from 2.5 to 4 L w/ sats low-mid 90s. Believes she is febrile w/ sweating and shakes, but has not taken her temperature. Advised we would schedule her with Dr Rolena Infante next Wed. Subsequent phone call, pt reports she discovered her concentrator was not working and she attributes this to her SOB. Concentrator is being repaired/replaced.  She reports she would rather see Dr Julaine Fusi, so she was advised we would schedule her with Dr Julaine Fusi on 4/17. She was further advised that if SOB worsened, to go to her local ED.  Sherlie Ban, RRT  ROAD Coordinator

## 2015-11-18 NOTE — Telephone Encounter (Signed)
Clinical Pharmacist Telephone Visit    HCV Treatment Follow Up    Sylvia Carter is a 60yrold female with HCV genotype 1a, treatment naive. Patient non-cirrhotic based on FIB-4 score 1.28, APRI score 0.297 and physical exam (clinic visit 06/28/15). Fibroscan 4.2 kPa c/w F0-F1 on 07/25/2015. Patient is on Harvoni (SOF/LDV) week 5 of 12.  Last seen in clinic on 06/28/15.    Significant PMH  Stage IV COPD --> undergoing evaluation for lung transplant    HCV Treatment Dates  Start Date: 10/14/2015  End Date:  01/06/2016      S/O: Called patient to discuss VL lab results and follow-up on the progress of the treatment. Patient reports she is currently having COPD exacerbations and is currently in the ER. She states she has her Harvoni with her and reports she has not missed any doses. She reports she is tolerating Harvoni well with the exception of feeling nauseated at times. Med profile reviewed and DDI with tums was discussed with her and reports she is currently not taking since starting treatment on Harvoni.     Week of tx BL Start date 4   Lab scheduled  10/14/2015 11/11/2015   Lab completed 05/20/2015  11/12/2015   Weight (kg)      WBC (4.5-11) 6.3     Hgb (12-16g/dL) 14.6     Hct (36-46%) 43.7     Plts (130-400) 274     Potassium 3.9  4.1   SCr(0.5-1.2) 0.58  0.6   EGFR  >60     Albumin 4.1  3.8   AST (15-43) 35  20   T.bili (0.3-1.3) 0.6  0.3   ALT (5-54) 36  20   HCV viral load 13,875,643 <15 Detected         Assessment: Sylvia Teagardenis a 89yrld female with HCV genotype 1a, treatment naive.  On Harvoni (SOF/LDV)  week 5 of 12. HCV viral load detectable < 15. All other labs are stable. Patient reports perfect adherence with treatment however has reported mild nausea, a common side effect associated with Harvoni.  Medication list reviewed and DDI detected with TUMS and Harvoni, however patient reports not currently taking. She is in the hospital due to COPD exacerbation and has her Harvoni with her. Discussed with her to  call usKoreance she is discharged from hospital to report any changes to her health status or any changes to her medications. Patient expressed understanding.     Plan:   1) F/U w/patient post discharge from ER   2) Continue on Harvoni for 12 weeks  3) Next labs due EOT 01/06/2016: CMP, VL  4)Instructed patient to call clinical pharmacist at ([phone] 2] for non-urgent questions.          Nefertiti Mohamad " [address]., BCPS  Hepatology/Infectious Diseases Clinical Pharmacist  [phone] 2        Time spent counseling and educating patient: 15 minutes

## 2015-11-19 NOTE — Telephone Encounter (Signed)
Cannot refill per RN protocol. Forwarding to Dr. Julaine Fusi.    Charyl Bigger, RN. BSN  Pulmonary Resource Press photographer phone for patients [phone]    Internal direct line (STAFF ONLY) [phone] (with voicemail)   Internal direct line (STAFF ONLY) [phone] (URGENT LINE without voicemail)  Pager [phone]  Fax [phone]

## 2015-11-22 NOTE — Telephone Encounter (Signed)
S/O:Spoke with patient to follow up on her  HCV VL ND and  post recent ER  Visit. Patient states she is doing ok after having  COPD exacerbation. She is adhering to her treatment. Reports no SE. Denies any missed  doses. She reports she was given prednisone post discharge.       A/P:Next labs EOT 01/06/2016.Patient expressed understanding to call if any changes to her meds/health status. Phone number was provided.    Kendle Erker " [address] [place], [place].D., BCPS  Hepatology/Infectious Diseases Clinical Pharmacist  [phone] option 2

## 2015-11-25 ENCOUNTER — Ambulatory Visit: Payer: BLUE CROSS/BLUE SHIELD | Attending: Internal Medicine | Admitting: PULMONARY DISEASE

## 2015-11-25 ENCOUNTER — Encounter: Payer: Self-pay | Admitting: PULMONARY DISEASE

## 2015-11-25 VITALS — BP 145/86 | HR 85 | Temp 97.6°F | Resp 16 | Wt 129.9 lb

## 2015-11-25 VITALS — BP 145/86 | HR 85 | Temp 97.6°F | Resp 16 | Ht 65.0 in | Wt 129.9 lb

## 2015-11-25 DIAGNOSIS — J449 Chronic obstructive pulmonary disease, unspecified: Principal | ICD-10-CM | POA: Insufficient documentation

## 2015-11-25 DIAGNOSIS — J45909 Unspecified asthma, uncomplicated: Secondary | ICD-10-CM | POA: Insufficient documentation

## 2015-11-25 DIAGNOSIS — J9859 Other diseases of mediastinum, not elsewhere classified: Secondary | ICD-10-CM | POA: Insufficient documentation

## 2015-11-25 DIAGNOSIS — J441 Chronic obstructive pulmonary disease with (acute) exacerbation: Secondary | ICD-10-CM

## 2015-11-25 NOTE — Nursing Note (Signed)
Patient was verified with 2 identifiers, vital signs taken, allergies verified, and screened for pain.  Patient comes in ambulatory.    Patient is in RM# 5    Patient's DME company is [employer].    Lovie Chol MA

## 2015-11-28 NOTE — Progress Notes (Signed)
Sylvia Carter is a 60 y/o lady here with her husband and daughter.  The patient was seen and examined with Sherlie Ban, RRT, AE-C in a follow up visit after hospitalization for acute respiratory failure requiring BiPAP.      Sylvia Carter has Very severe Stage 4 oxygen-dependent COPD as well as ACOS, GERD, Hepatitis C and a 2.6 lobulated anterior mediastinal mass evaluated by Professor Julaine Fusi with a PET CT san done January 20176 which demonstrated low level FDG activity and no where else.      The Terrible Ts and lymphoma are in the differential diagnosis and Dr. Julaine Fusi in researching the medical records found she probably also had mediastinal and hilar adenoopathy back in June 2013.  This could be due to her COPD but it predated her present mediastinal abnormalities.  The decision was to postpone any decision on surgery.  I concur with Sylvia Carter, Dr. Marissa Nestle of Thoracic Surgery and Dr. Julaine Fusi given the advanced stage of her COPD.  We await the minutes from the Thoracic Tumor Board that recentlly or will convene to discuss specific details of Sylvia Carter's case.    Since hospital discharge, Sylvia Carter has very slowly regained her endurance with exercise.   I reviewed the history of the present illness, including the chief complaint SOB and interval history.      SYMPTOMS include DOE.  She denies any fever, chest pain or hemoptysis.    Current IMPAIRMENT evidenced by symptoms and albuterol rescue, and Asthma Control Test score of 13 out of 25.    According to the NIH-NAEPP Expert Panel Report 3, an Asthma Control Test score between 5 and 15 indicates Very Poorly Controlled asthma, 16 to 41 Not Well Controlled asthma, and 20 to 25 Well Controlled asthma.    RISK of future acute asthma or COPD exacerbation 2 to 3 per year.    Since last visit,  the patient's asthma CONTROL has worsened, and she is still recovering from her hospitalization.  May take 3 to 6 months to get back to baseline.    One Emergency department visits, one hospitalization,  one urgent care clinic visits, 2 prednisone bursts.      Past Medical History   Diagnosis Date    COPD (chronic obstructive pulmonary disease)     Hepatitis C        Family History   Problem Relation Age of Onset    type 1 diabetes [OTHER] Mother     Diabetes Father      MI in 66s    emphysema [OTHER] Maternal Grandfather     Other [OTHER] Maternal Grandmother      possible heart failure       Social History     Social History    Marital status: MARRIED     Spouse name: N/A    Number of children: N/A    Years of education: N/A     Occupational History    Not on file.     Social History Main Topics    Smoking status: Former Smoker     Packs/day: 0.50     Years: 20.00     Types: Cigarettes     Quit date: 03/02/2012    Smokeless tobacco: Never Used    Alcohol use Not on file      Comment: occasional    Drug use: Not on file    Sexual activity: Not on file     Other Topics Concern    Not on file  Social History Narrative       CURRENT MEDICATIONS include:    Current Outpatient Prescriptions on File Prior to Visit   Medication Sig Dispense Refill    Albuterol (PROAIR HFA, PROVENTIL HFA, VENTOLIN HFA) 90 mcg/actuation inhaler Take 1-2 puffs by inhalation every 4 hours if needed. 1 inhaler 6    Azithromycin (ZITHROMAX Z-PAK) 250 mg Tablet Take 2 tabs orally on first day, then one daily until gone (Patient not taking: Reported on 06/28/2015           ) 6 tablet 0    Fluticasone (FLOVENT HFA) 110 mcg/actuation Inhaler Take 2 puffs by inhalation every 12 hours. 12 g 3    Ledipasivir 90 mg/Sofosbuvir 400 mg (HARVONI) 90-400 mg Tablet Take 1 tablet by mouth every day. 28 tablet 2    PredniSONE (DELTASONE) 20 mg Tablet Take 1 tablet by mouth every day. To be taken as instructed by MD in case of pulmonary exacerbation 30 tablet 0    Roflumilast (DALIRESP) 500 mcg Tablet Tablet Take 1 tablet by mouth every day. 30 tablet 11    tiotropium-olodaterol (STIOLTO RESPIMAT) 2.5-2.5 mcg/actuation Mist Take 2 puffs  by inhalation every day. 1 g 11     No current facility-administered medications on file prior to visit.        No Known Allergies      REVIEW OF SYSTEMS (ROS) All systems were reviewed and are negative except as noted above.     The negative ROS include absence of other constitutional, eye, ENT including nasal ulcers, cardiac, GI, musculoskeletal, skin including rashes and purpura, neurological, including mononeuropathies, e. g. peripheral nerve pain affecting hands, arms, legs, psychiatric, endocrine, and adverse reactions to prescribed drugs.    PHYSICAL EXAMINATION  Vitals:    11/25/15 1049 11/25/15 1053   BP: (!) 142/91 145/86   Pulse: 85    Resp: 16    Temp: 36.4 C (97.6 F)    TempSrc: Temporal    SpO2: 98%    Weight: 58.9 kg (129 lb 14.4 oz)    Height: 1.651 m (5\' 5" )      Constitutional: The patient is a well-developed female wearing oxygen and genuinely glad to see me.  Sylvia Carter was my patient years ago.  Ear, Nose, Mouth & Throat:  Oropharynx is benign.  no oral thrush.  Mallampati 2.  No palpable lymphadenopathy.  Sinus non-tender  no drainage.  Respiratory: Chest distant breath sounds but clear.  Cardiovascular:  Cardiac murmur no.  Abdomen: No masses.  Trudeau sign no.  Skin: Without rash  Musculoskeletal: Peripheral edema none.  Neuro: Focal neurological findings none.    LABORATORY AND RADIOLOGIC FINDINGS  I personally reviewed and interpreted the following:  FDG-PET/CT LOCALIZATION IMAGING  EXAM DATE: 08/30/2015 1:39 PM   COMPARISON: None  CORRELATIVE IMAGING: 07/25/2015 CT chest    CMS CODING: Initial Treatment Strategy    INDICATION: 60 year-old woman with a 2.6 cm anterior mediastinal mass  demonstrated on 07/25/2015 CT chest. Former heavy smoker with severe  emphysema.     RADIOPHARMACEUTICAL AND TECHNIQUE: Following a 15+ hour fast, fingerstick  blood glucose was 104 mg/dl. 9.547 mCi F-18 FDG was injected IV, via a  right antecubital vein. There was no report of dose infiltration or IV  problems  noted. Patient rested quietly in a dimly lit room for 61 minutes  after injection. Patient was allowed to void immediately prior to imaging.    Non-contrast spiral CT was performed for patient positioning  and  attenuation correction. A 3D PET scan was then acquired from the  infraorbital region to the proximal thighs with arms up. PET data was  reconstructed, attenuation-corrected, and reformatted into 3 orthogonal  planes. Images were reviewed in multiple softcopy formats, including  attenuation-corrected and non-corrected PET, CT, and fused PET/CT.    RADIATION DOSE:    This study involved (1) CT acquisition(s). The CTDIvol and DLP values are  included below as required by state law:    1; Series: 2; Neck/Abdomen/Pelvis; 32 cm; CTDIvol=3.8 mGy; DLP 341.4 mGy-cm    Total estimated effective dose for this examination: 5.9 mSv  For further information on CT radiation dose, see  [email or web address]      FINDINGS:    FDG SUV activity measured in the units of g/mL unless otherwise stated.    Representative index lesions:    Anterior mediastinal mass on image 77 measures 2.1 x 2.5 cm, SUV max 3.    Lymph node in the fat anterior to the pericardium on image 106 measures 0.7  x 1.2 cm, SUV max 1.5, below blood pool.    Mild fullness of the left adrenal, with activity well below liver  background, suggestive of benignity.    Incidental, but potentially significant findings which are incompletely  evaluated on the non-contrast CT, are unremarkable on FDG-PET. These  include the following:   Severe emphysematous changes in the bilateral lungs.    IMPRESSION:    1. Anterior mediastinal mass demonstrates low level FDG activity.    2. No evidence of distant metastatic disease outside of the thorax.    NOTE: THE NON-CONTRAST CT COMPONENT OF THIS SCAN WAS OPTIMIZED FOR PET  TECHNIQUE. THIS CT SCAN IS CONSIDERED A DIAGNOSTIC SCAN BUT IS NOT A  CONTRAST ENHANCED SCAN.    Preliminary  Report Electronically Signed By: Evorn Gong, MD on 08/30/2015  4:46 PM    I have personally reviewed the images of this study and agree with the  above report.    Final Report Electronically Signed By: Jeanene Erb, M.D. on 08/30/2015  6:06 PM    PFTs done 04/20/2014  FEV1 0.61 (23% predicted), FVC 2.93 (85% predicted), FEV1/FVC ratio 21%  Lung volumes RV 206% predicted, TLC 130% predicted, SVC 85% predicted    DLco 41%  KCO 69% predicted suggesting pulmonary vascular disease     IMPRESSION  1. COPD, Stage 4 Very Severe  2. Anterior mediastinal mass, low FDG uptake  3. Asthma COPD Overlap Syndrome    RECOMMENDATIONS  Medical Decision Making  Established problem and new problems recognized today.  Additional workup planned.    Asthma + COPD action plan  Continue Stiolto Respimat 2 puffs QC  Continue Daliresp 500 mg every day  Continue Flovent 110 2 puffs BID    Consider BiPAP at home to reduce her work of breathing and to rest her diaphgram  Consider Smartvest to help with pulmonary hygiene    Face-to-face time of this patient visit is 41 minutes. Greater than 70 % of the time was spent discussing the patient's disease, prognosis, symptoms, and treatment plan, including the proper use of inhalers.  The goals of chronic disease management were defined:  Control symptoms and prevent exacerbations.  Reduce impairment and risk from asthma to achieve better asthma control.  Discussed the importance of recognizing the difference between DANGER and DISCOMFORT from asthma.    Sherlie Ban, RRT, AE-C and I thank you for the opportunity to consult on this interesting case.  The patient will return in 3 to 4 months at which time I will correspond with you once again.  With all good wishes, I remain    Electronically signed by:    Leana Roe, Darol Destine

## 2015-12-09 ENCOUNTER — Encounter: Payer: Self-pay | Admitting: Internal Medicine

## 2016-01-15 ENCOUNTER — Telehealth: Payer: Self-pay | Admitting: GASTROENTEROLOGY

## 2016-01-15 NOTE — Telephone Encounter (Signed)
Patient called in to see if blood test came back and to go over with them if possible. Was done last week    [place]  [facility]  [phone]

## 2016-01-15 NOTE — Telephone Encounter (Signed)
Reviewed EMR. No recent labs in EMR.  Pt was on HCV treatment and the end of treatment labs were to be done 01/06/16. Will route to HCV Team to contact pt regarding lab results. Thanks  Sharlot Gowda, RN

## 2016-01-15 NOTE — Telephone Encounter (Signed)
Forgot to route last note.. Please see attached message. Sharlot Gowda, RN

## 2016-01-16 ENCOUNTER — Telehealth: Payer: Self-pay | Admitting: Pharmacist

## 2016-01-16 NOTE — Telephone Encounter (Signed)
Clinical Pharmacist Telephone Visit    HCV End of Treatment    Sylvia Carter is a 60yrold female with HCV genotype 1a, treatment naive. Fibroscan 4.2 kPa c/w F0-F1 on 07/25/2015. Patient has completed 12 weeks for treatment with Harvoni (SOF/LDV) on 01/06/16.    Significant PMH  Stage IV COPD --> undergoing evaluation for lung transplant    HCV Treatment Dates  Start Date: 10/14/2015  End Date:  01/06/2016    S/O: Called patient to discuss lab results and treatment progress. Patient confirms completing HCV treatment on 01/06/16. She reports tolerating treatment well, but states that she has been feeling more fatigued lately which she attributes to worsening lung disease.     Week of tx BL Start date 4 12   Lab scheduled  10/14/15 11/11/15 01/06/16   Lab completed 05/20/15  11/12/15 01/07/16   Weight (kg)       WBC (4.5-11) 6.3      Hgb (12-16g/dL) 14.6      Hct (36-46%) 43.7      Plts (130-400) 274      Potassium 3.9  4.1 4.0   SCr(0.5-1.2) 0.58  0.6 0.6   EGFR  >60      Albumin 4.1  3.8 4.0   AST (15-43) 35  20 23   T.bili (0.3-1.3) 0.6  0.3 0.6   ALT (5-54) 36  20 21   HCV viral load 17,741,287 <15 Detected <15 ND     Assessment:  HCV viral load not detected at EOT. Transaminases improved from baseline. Rest of labs stable. Patient tolerated HCV treatment well w/excellent medication adherence.     Plan:   1) SVR labs due 03/30/16:  CMP, HCV viral load  2) Follow-up visit with treating physician has not yet been scheduled  3) Instructed patient to call clinical pharmacist at ([phone] [phone][option 2] for non-urgent questions.      RSerita Kyle PharmD  Hepatology/Infectious Diseases Clinical Pharmacist  [phone]       Time spent counseling and educating patients: 5 minutes

## 2016-02-28 NOTE — Progress Notes (Signed)
Sylvia Carter, can we schedule this patient to followup with me?  thanks  Mike Craze

## 2016-02-28 NOTE — Progress Notes (Signed)
Rc'd call from pt who has been referred by her PMD to [facility] neurology clinic for an issue with her eye. [facility] is asking for imaging disc w/ PET scan, CCT. Request for same faxed to radiology records.    Ashok Cordia, RRT  ROAD Coordinator

## 2016-03-02 ENCOUNTER — Telehealth: Payer: Self-pay | Admitting: PULMONARY DISEASE

## 2016-03-02 NOTE — Telephone Encounter (Signed)
-----   Message from Sherlie Ban, [place] sent at 02/28/2016  5:29 PM PDT -----  Hola,  Please schedule this pt for f/u w/ Dr Julaine Fusi in his next avail.  Thanks,  [employer]

## 2016-03-02 NOTE — Telephone Encounter (Signed)
Patient has been identified by three identifiers.      I called patient to schedule appt. Patient states she currently is having issues with her and she is going to [facility] for treatment and might need surgery. At this time she would like to keep her schedule open just incase she needs future appointments at [facility].     She currently has an appt on Aug. 2 at [facility] and she will have more information.     She will call when she is ready to schedule appt.    Thank you,   Fabio Asa. [place], [place]

## 2016-03-31 ENCOUNTER — Other Ambulatory Visit: Payer: Self-pay | Admitting: Internal Medicine

## 2016-03-31 DIAGNOSIS — J449 Chronic obstructive pulmonary disease, unspecified: Principal | ICD-10-CM

## 2016-03-31 MED ORDER — UMECLIDINIUM 62.5 MCG-VILANTEROL 25 MCG/ACTUATION POWDR FOR INHALATION
1.0000 | DISK | Freq: Every day | RESPIRATORY_TRACT | 6 refills | Status: DC
Start: 2016-03-31 — End: 2017-04-03

## 2016-04-03 ENCOUNTER — Telehealth: Payer: Self-pay

## 2016-04-03 NOTE — Telephone Encounter (Signed)
Rc'd call from pt who asked for clarification of recent medication order for Anoro. Pt advised Stiolto was no longer covered by insurance, so was changed to [employer]. She will continue to take Flovent.  In addition, patient related result of recent visit to [facility] where she was seen by both neurology and thoracic surgery for diagnosis of myasthenia gravis. Pt reports Dr Amil Amen will be performing a thymectomy as we as lung reduction of RUL. She says these procedures are scheduled for 9/27.    Ashok Cordia, RRT  ROAD Coordinator

## 2016-04-07 ENCOUNTER — Telehealth: Payer: Self-pay | Admitting: Pharmacist

## 2016-04-07 NOTE — Telephone Encounter (Signed)
Clinical Pharmacist Telephone Visit    HCV Treatment: SVR12 ACHIEVED    Sylvia Carter is a 60yrold female with HCV genotype 1a, treatment naive. Fibroscan 4.2 kPa c/w F0-F1 on 07/25/2015. Patient has completed 12 weeks for treatment with Harvoni (SOF/LDV) on 01/06/16.    Significant PMH  Stage IV COPD    HCV Treatment Dates  Start Date: 10/14/2015  End Date: 01/06/2016    S/O: Called patient to review SVR labs and follow up with post treatment progress. LM for patient to return my call.      Week of tx BL Start date 4 12 24    Lab scheduled  10/14/15 4/3 5/29 8/21   Lab completed 05/20/15  4/4 5/30 8/23   WBC (4.5-11) 6.3       Hgb (12-16g/dL) 14.6       Hct (36-46%) 43.7       Plts (130-400) 274       Potassium 3.9  4.1 4.0 3.8   SCr(0.5-1.2) 0.58  0.6 0.6 0.7   EGFR  >60       Albumin 4.1  3.8 4.0 4.2   AST (15-43) 35  20 23 20    T.bili (0.3-1.3) 0.6  0.3 0.6 0.4   ALT (5-54) 36  20 21 19    HCV viral load 12,929,090 <15 Detected <15 ND ND       Assessment: HCV viral load remains undetectable. SVR 12 has been achieved.  Will continue to try to reach the patient.     Plan:  1) This is considered a cure for HCV: problem list updated and open standing HCV lab orders discontinued  2) Follow-up visit with treating provider has not been completed.   3) [facility] visit with the treating provider and discussion with the pharmacist.    4) Instructed patient to call clinical pharmacist at ([phone] [phone][option 2]      RSerita Kyle PharmD  Hepatology/Infectious Diseases Clinical Pharmacist  [phone]

## 2016-04-08 NOTE — Telephone Encounter (Addendum)
Patient returned call. Reviewed lab results and congratulated patient on SVR. Patient also noted that she was diagnosed with myasthenia gravis ~1-2 weeks after completing treatment with Harvoni, inquiring whether this may be related to HCV treatment. Discussed with patient that there are no reports that we know of, difficult to determine exact cause. Patient seen at [facility] per scanned records, no mention of possible etiology in consult note. Advised patient we would complete a Med[employer] report, but unlikely related to HCV treatment. Patient expressed understanding.     Educated patient that she is cured from [place] but is not immune to reinfection with HCV.  Educated patient on HCV modes of transmission and to avoid sharing needles, intranasal drug use, sharing razors/toothbrushes, tattoos, or unprotected sex. Educated patient to avoid ETOH, smoking or other agents that may further damage the liver. Patient expressed understanding.    Waiting to hear from treating provider whether follow up in clinic is necessary. Advised patient to follow up if called for appointment.     Boykin Nearing, Pharm.D.  Hepatology/Infectious Diseases Clinical Pharmacist  Phone: [phone]

## 2016-04-08 NOTE — Telephone Encounter (Signed)
Thanks for follow up. No need for follow up in clinic unless patient requests it.    Melida Quitter, MD

## 2017-04-03 ENCOUNTER — Other Ambulatory Visit: Payer: Self-pay | Admitting: Internal Medicine

## 2017-04-03 DIAGNOSIS — J449 Chronic obstructive pulmonary disease, unspecified: Principal | ICD-10-CM

## 2017-04-05 NOTE — Telephone Encounter (Signed)
Service:Tuesday  Last visit: 11/24/16      Future visit: none    Unable to refill prescription per RN prescription refill protocol. Deferring to provider for review.     Briant Cedar, RN  [phone]

## 2021-12-11 IMAGING — MR e+1 CRANIO/FACE
15 of 19 series · 20 of 48 positions shown · IV contrast (gadolinium)
Comparison: none

------------- REPORT [id number] -------------
TECHNIQUE: Multiplanar sequences, weighted in T1, T2, FLAIR, and Diffusion, before and after intravenous administration of the paramagnetic contrast agent.
Report:
There is no evidence of intracranial expansive process, acute intraparenchymal hemorrhage, acute/subacute ischemia, as well as extra-axial fluid collections above or below the tentorium.
The ventricular system is of normal topography, morphology, and dimensions.
Rare and scattered areas of hypersignal in T2/FLAIR sequences, affecting the white matter of the cerebral hemispheres, without expansive effect.
The remaining brain parenchyma shows preserved shape and signal characteristics.
No areas with abnormal gadolinium enhancement are identified.
No areas with water diffusion restriction are identified in the ECOPLANAR sequence.
Normal flow in the major arteries of the vertebrobasilar and carotid system, according to the Spin-Echo criteria.
Note: Findings related to the central face are described in a specific report conducted on the same day.

[Series 3: FLAIR · sagittal · 5.0mm · 0.49mm/px · 2 of 27 slices shown (1 of 3)]
[im 1/27]
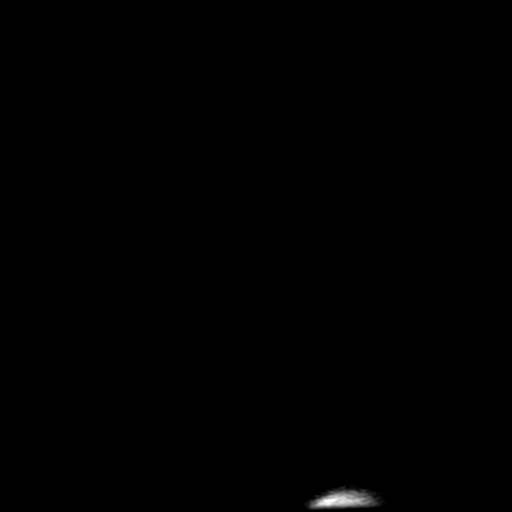
[im 27/27]
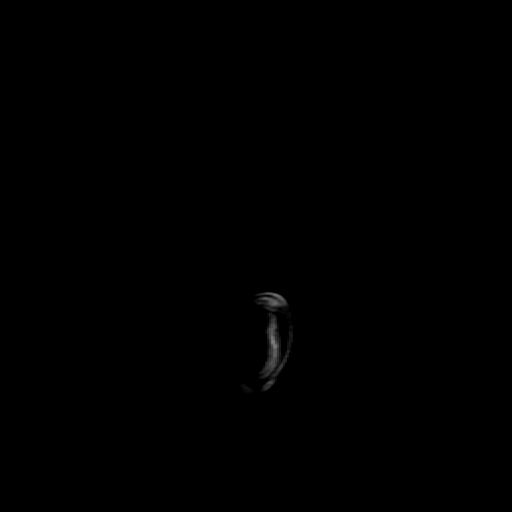

[Series 4: FLAIR · axial · 5.0mm · 0.47mm/px · z∈[-49,+104]mm · 2 of 27 slices shown (2 of 3)]
[im 1/27]
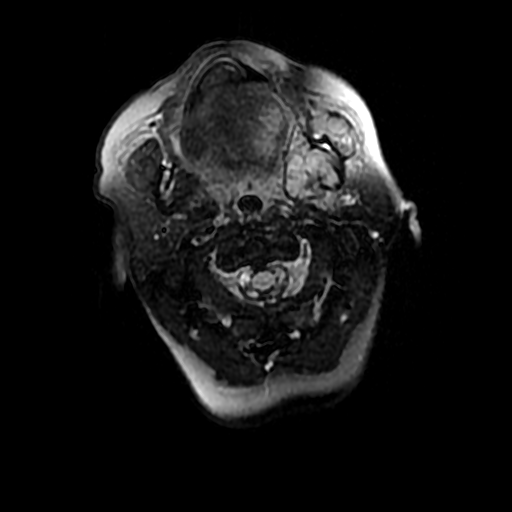
[im 27/27]
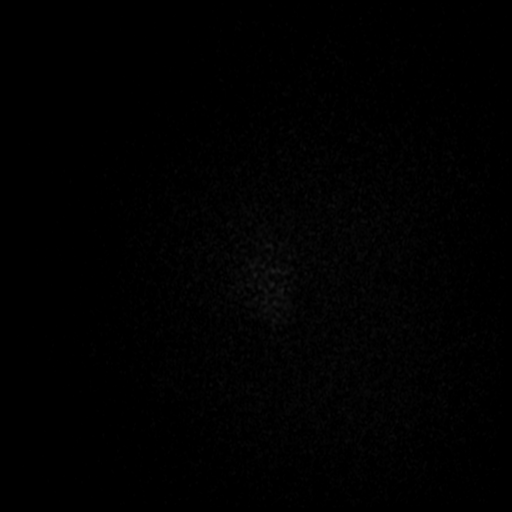

[Series 5: T2 · axial · 5.0mm · 0.47mm/px · 1 of 27 slices shown]
[im 1/27]
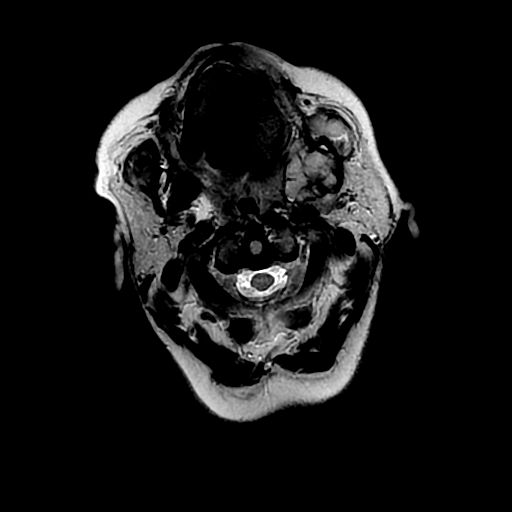

[Series 6: ax difusao · axial · 5.0mm · 0.94mm/px · z∈[-49,+104]mm · 2 of 54 slices shown]
[im 1/54]
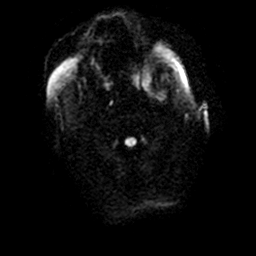
[im 54/54]
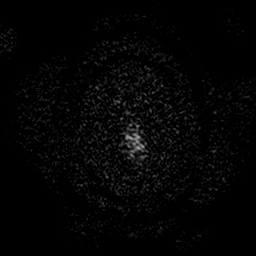

[Series 7: T2-star · axial · 5.0mm · 0.94mm/px · 1 of 27 slices shown]
[im 1/27]
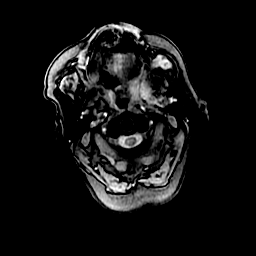

[Series 8: T1 · sagittal · 4.0mm · 0.47mm/px · 1 of 24 slices shown (1 of 4)]
[im 1/24]
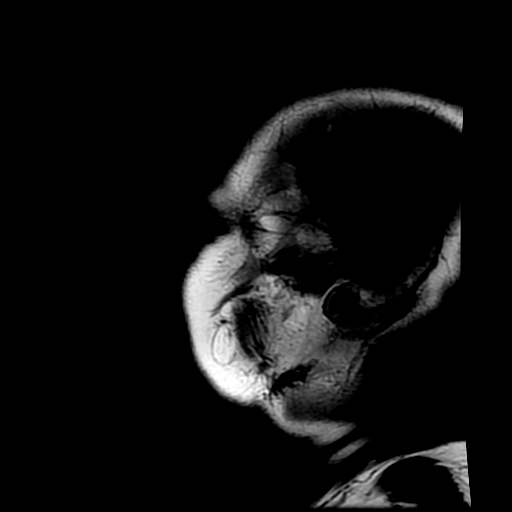

[Series 9: T1 · coronal · 4.0mm · 0.47mm/px · 1 of 29 slices shown (2 of 4)]
[im 1/29]
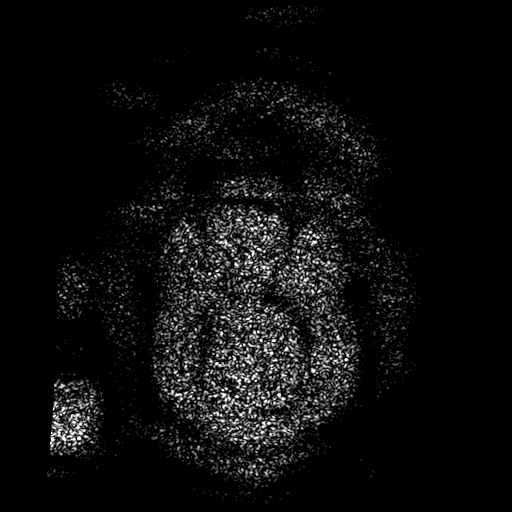

[Series 10: T1 · axial · 4.0mm · 0.39mm/px · 1 of 32 slices shown (3 of 4)]
[im 1/32]
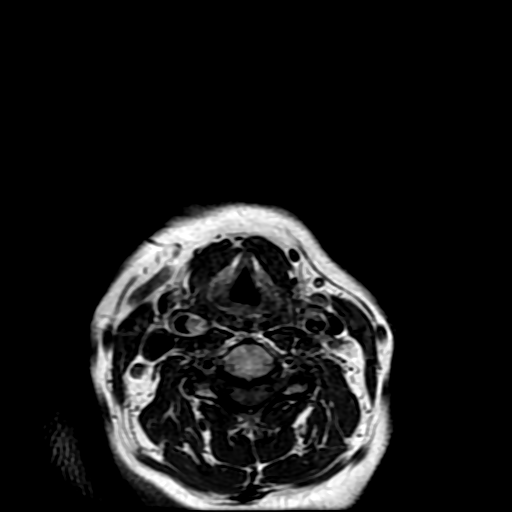

[Series 11: STIR · sagittal · 4.0mm · 0.47mm/px · 1 of 24 slices shown (1 of 3)]
[im 1/24]
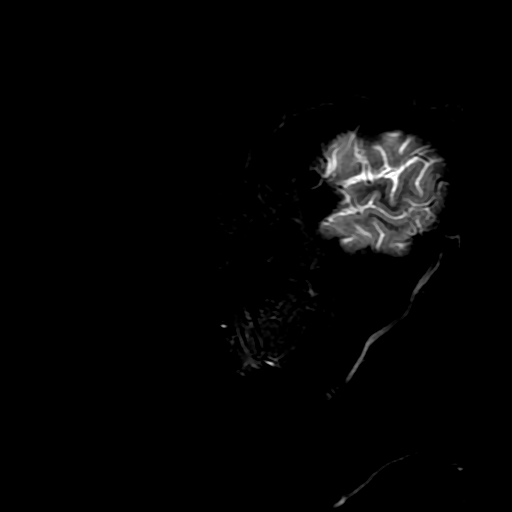

[Series 12: STIR · coronal · 4.0mm · 0.47mm/px · 1 of 29 slices shown (2 of 3)]
[im 1/29]
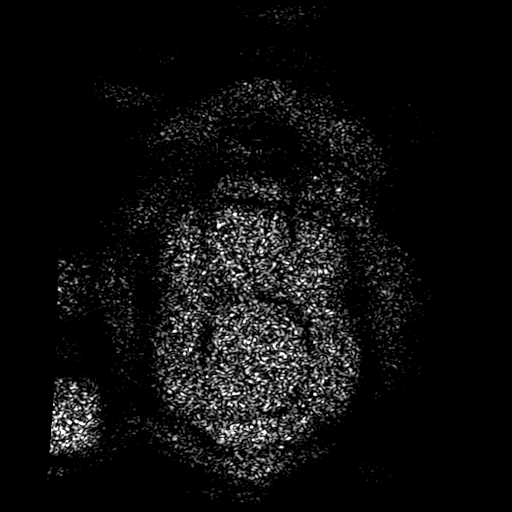

[Series 13: STIR · axial · 4.0mm · 0.39mm/px · 1 of 30 slices shown (3 of 3)]
[im 1/30]
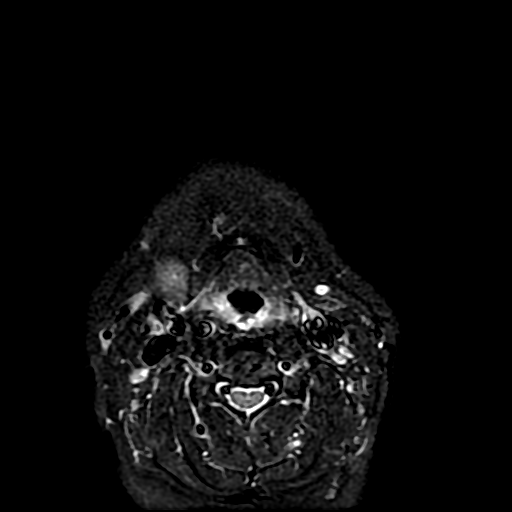

[Series 14: sag spgr fs · sagittal · 1.2mm · 0.50mm/px · 3 of 272 slices shown]
[im 1/272]
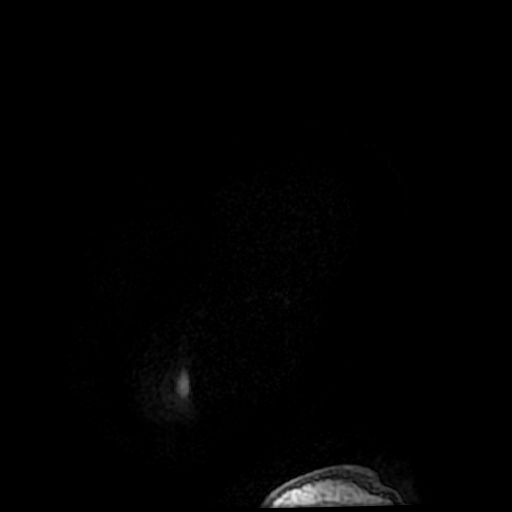
[im 55/272]
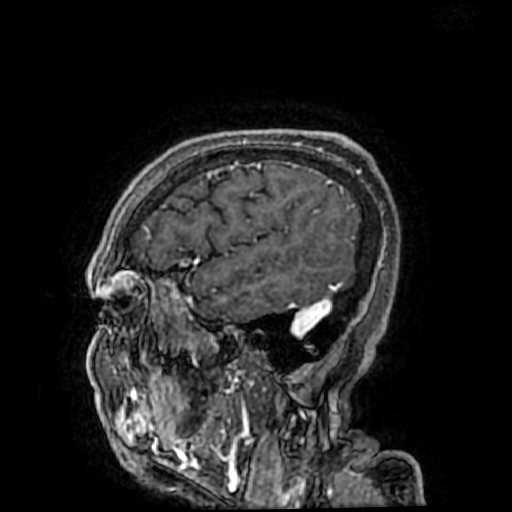
[im 82/272]
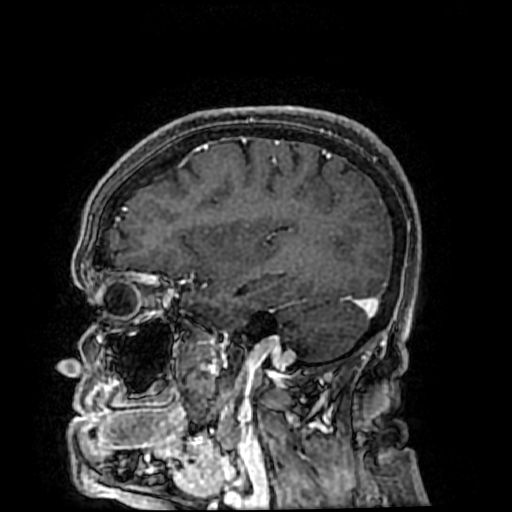

[Series 15: T1 · axial · 4.0mm · 0.39mm/px · 1 of 32 slices shown (4 of 4)]
[im 1/32]
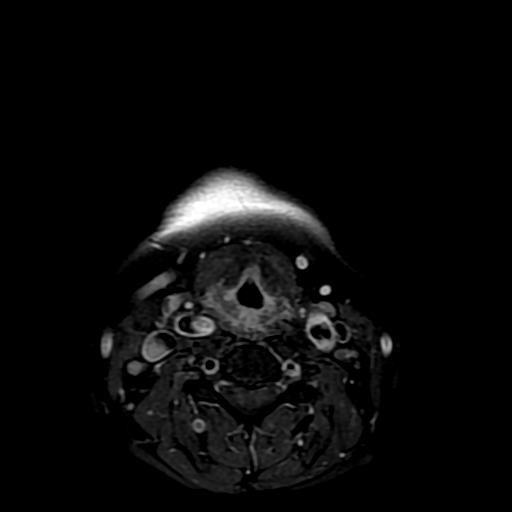

[Series 650: ADC · axial · 5.0mm · 0.94mm/px · 1 of 27 slices shown]
[im 1/27]
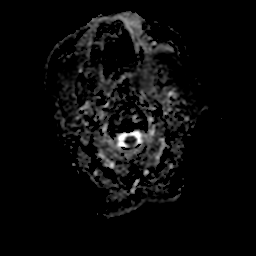

[FLAIR · axial · 5.0mm · 0.47mm/px · 1 of 27 slices shown (3 of 3)]
[im 1/27]
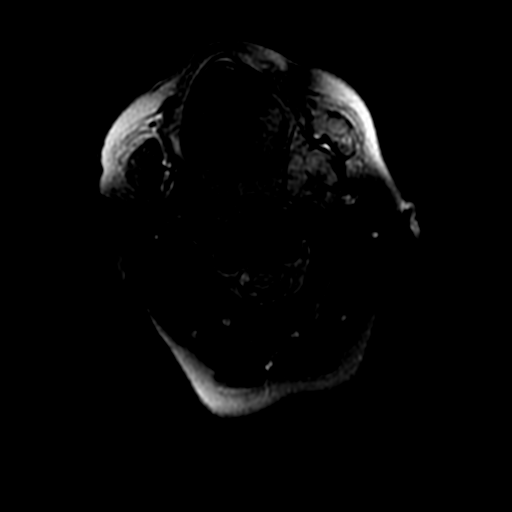

[20 of 48 positions shown; findings below may reference images not displayed]

IMPRESSION: Rare foci of signal alteration in the supratentorial white matter, nonspecific, more frequently associated with gliosis resulting from microangiopathy ([facility]azekas I).

------------- REPORT [id number] -------------
Method:
Facial sinus magnetic resonance imaging exam performed using a fast spin-echo technique with T1, diffusion, and T2-weighted sequences, with multiplanar acquisitions, before and after injection of paramagnetic contrast medium.
Analysis:
Post-surgical control study of the aforementioned left maxillary sinus neoplasm according to the clinical data provided.  We do not have pre-surgical exams for comparative analysis.
FACIAL MAGNETIC RESONANCE IMAGING
Signs of surgical manipulation in the left maxillomandibular region with resection of the medial wall of the maxillary sinus on that side.
An expansive lesion with lobulated contours is highlighted, located in the left masticator space, involving the bellies of the left pterygoid muscles, maintaining intimate contact and without cleavage planes with the mandibular ramus on that side. This lesion is located posterior to the mandibular ramus and in close proximity to the deep lobe of the left parotid gland, making it impossible to rule out an origin from this location.
The lesion is characterized by heterogeneous hypersignal in T2, intense enhancement by the paramagnetic contrast medium, and diffusion restriction. It measures approximately 4.0 x 3.2 cm in its largest axes on the axial plane.
There is another lesion located anterior to the mandibular ramus, with similar characteristics measuring approximately 2.6 x 2.3 cm.
These lesions are suggestive of persistent/recurrent lesion.
Asymmetry in the enhancement of the pterygopalatine fossa is highlighted, with more intense enhancement on the left, and dissemination to this location cannot be ruled out.
Hydrated material filling the mastoid cells and the left tympanic cavity.
Prominent oval-shaped lymph node with regular contours in the left submandibular region measuring approximately 1.8 cm.
Other segments of the parotid and submandibular glands with preserved morphology and signal.
Preserved nasopharynx.
Thickening of the mucosa lining the other paranasal sinuses, more evident in the anterior left ethmoid labyrinth.
Resection of the left inferior nasal concha.
Bullae of the middle nasal conchae.
Free meatuses and nasal fossae.
Nasal septum deviated to the left.
Choanal regions with preserved appearance.
Orbits without particularities.
CONCLUSION: Post-surgical control study of a left maxillary sinus neoplasm with signs of persistent/recurrent lesion as detailed in the body of the report.

## 2022-10-18 IMAGING — MR e+1 RM FACE
4 of 7 series · 19 of 48 positions shown · non-contrast
Comparison: none

[Series 3: T1 · sagittal · 4.0mm · 0.47mm/px · 7 of 30 slices shown (1 of 4)]
[im 1/30]
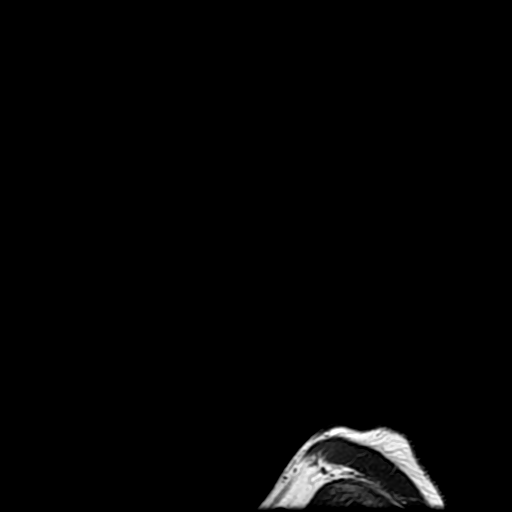
[im 5/30]
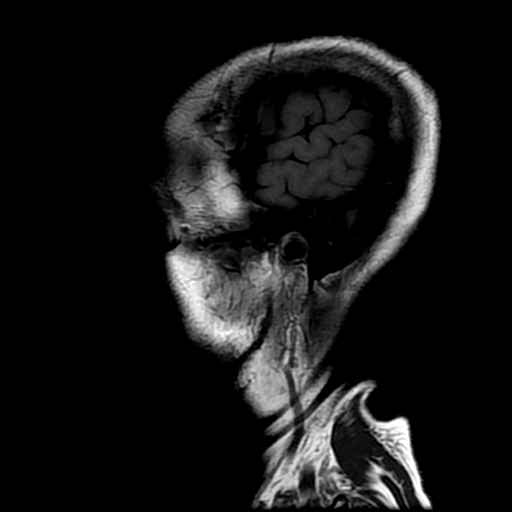
[im 10/30]
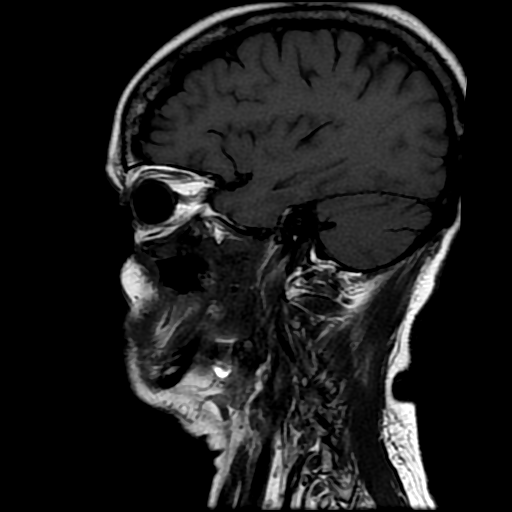
[im 15/30]
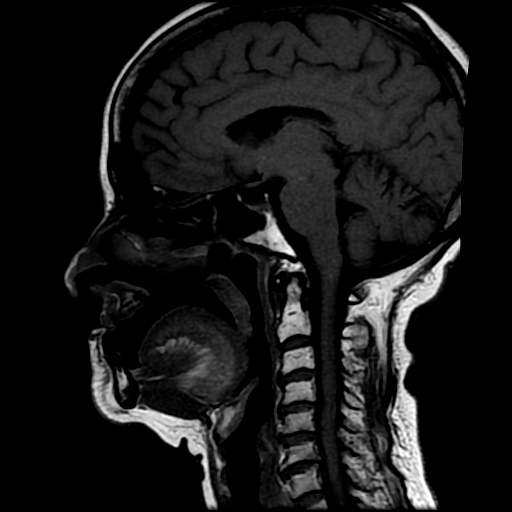
[im 20/30]
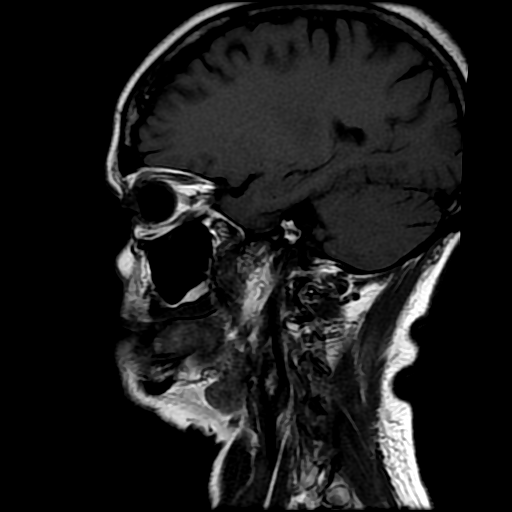
[im 25/30]
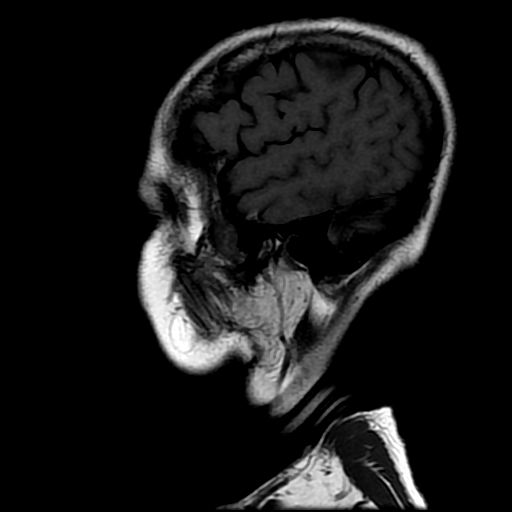
[im 30/30]
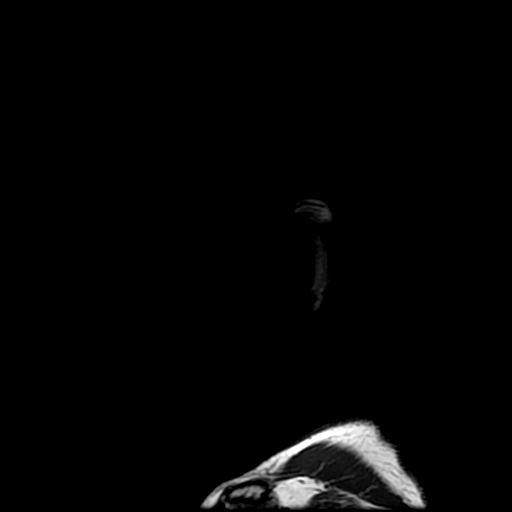

[Series 4: T1 · coronal · 4.0mm · 0.47mm/px · 6 of 33 slices shown (2 of 4)]
[im 1/33]
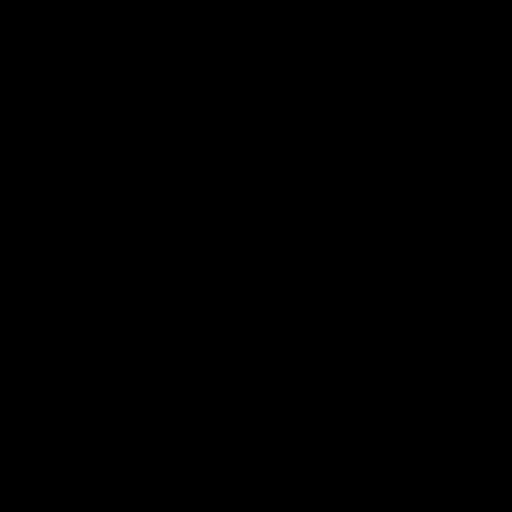
[im 6/33]
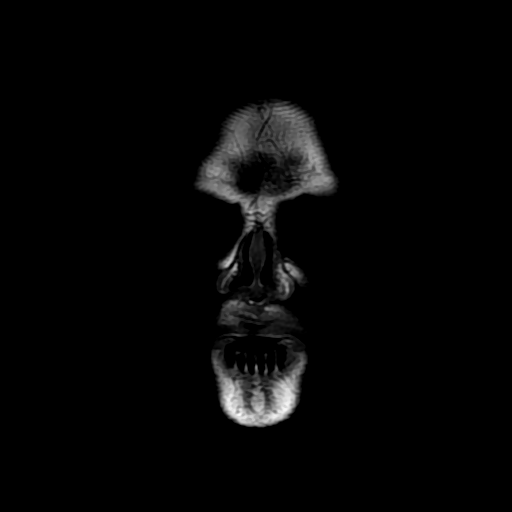
[im 11/33]
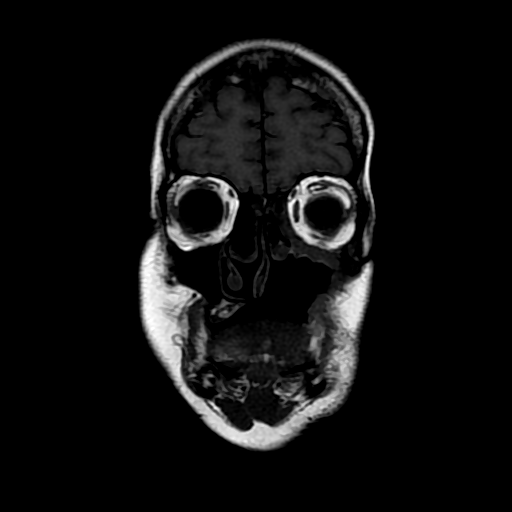
[im 17/33]
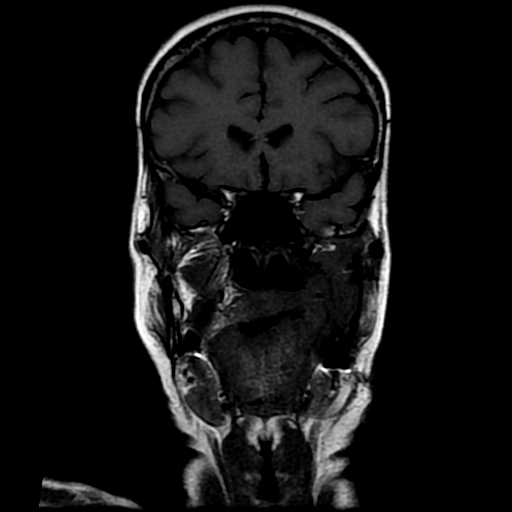
[im 22/33]
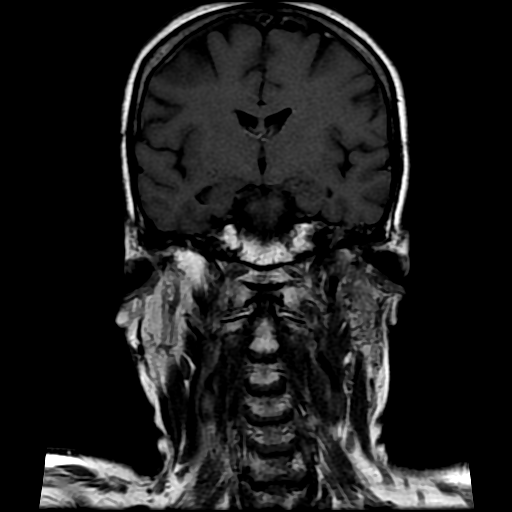
[im 27/33]
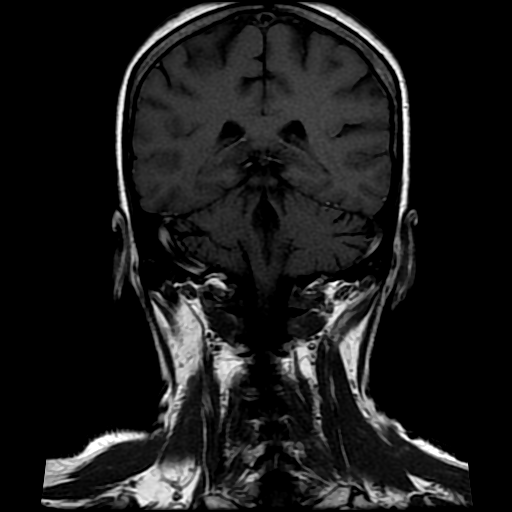

[Series 5: T1 · axial · 4.0mm · 0.39mm/px · z∈[-85,+15]mm · 3 of 32 slices shown (3 of 4)]
[im 6/32]
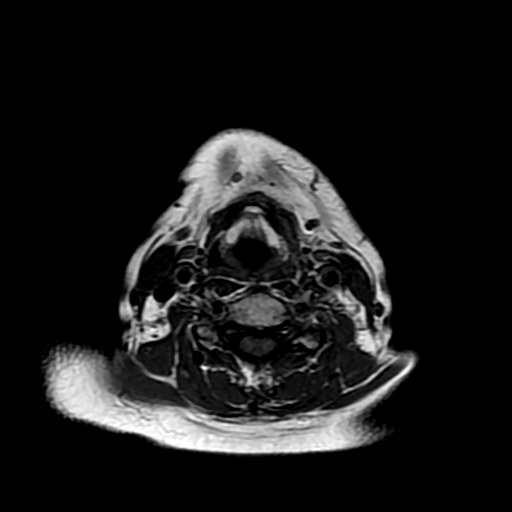
[im 16/32]
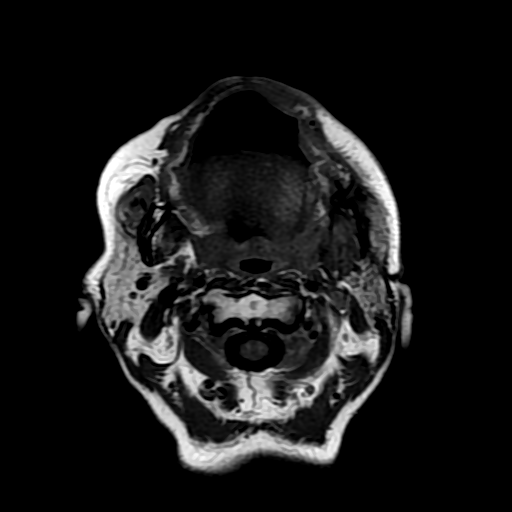
[im 26/32]
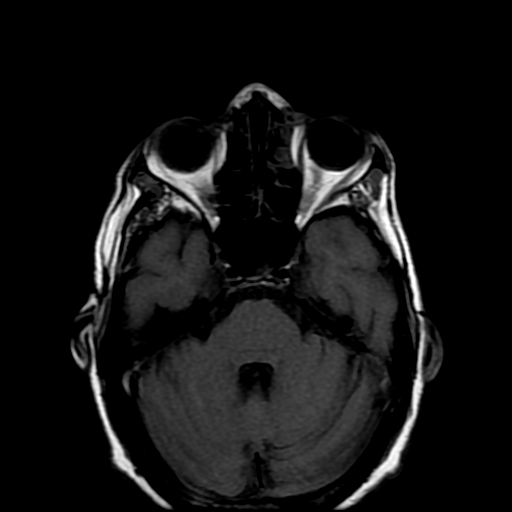

[Series 9: T1 · axial · 4.0mm · 0.39mm/px · z∈[-85,+15]mm · 3 of 32 slices shown (4 of 4)]
[im 6/32]
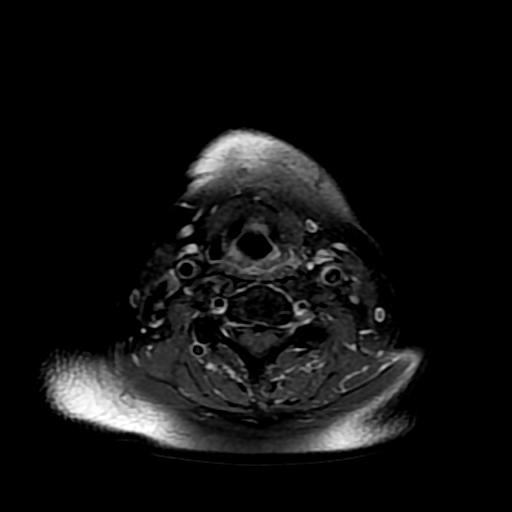
[im 16/32]
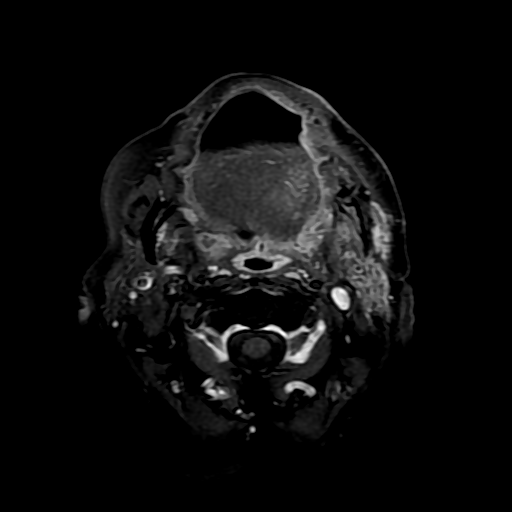
[im 26/32]
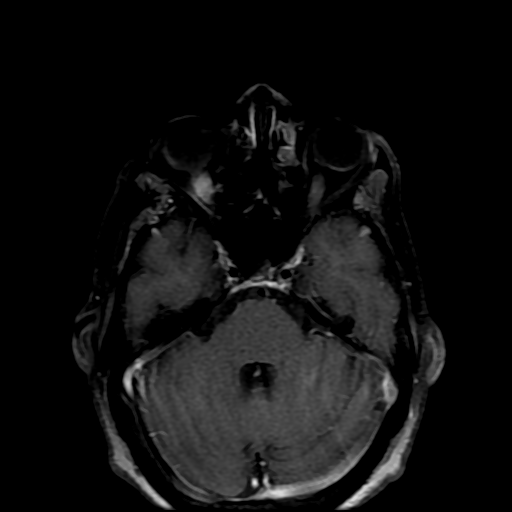

[19 of 48 positions shown; findings below may reference images not displayed]

Técnica:
Sequências multiplanares, ponderadas em T1 e T2, antes e após a injeção endovenosa de contraste
paramagnético endovenoso.
Relatório:
Comparativamente ao estudo anterior datado de 11/12/2021 notam-se sinais de manipulação cirúrgica com
ressecção das lesões expansivas descritas anteriormente. Não foram observadas linfonodomegalias atuais.
Material metálico adjacente ao ângulo mandibular esquerdo, produtor de artefatos que limitam a avaliação
RESSONÂNCIA MAGNÉTICA DA FACE
local.
Sinais de manipulação cirúrgica com maxilectomia, palatectomia e turbinectomia inferior à esquerda,
notando-se ampla comunicação entre o seio maxilar, cavidade oral e cavidade nasal deste lado
Espessamento de planos musculo adiposos do espaço mastigatório esquerdo, com realce ao meio de
contraste (tecido fibrocicatricial? lesão remanecescente?). Sugere-se controle.
Assimetria de realce das glândulas parótidas, maior à esquerda.
Espessamento mucoso do seio frontal esquerdo e de células etmoidais ipsilaterais.
Estruturas orbitárias com aspecto usual.
Rino e orofaringe de aspecto preservado.
Glândulas submandibulares sem alterações.
Acúmulo de secreção nas mastoides bilateralmente.
Impressão:
Material metálico adjacente ao ângulo mandibular esquerdo, produtor de artefatos que limitam a avaliação
local.
Sinais de manipulação cirúrgica com maxilectomia, palatectomia e turbinectomia inferior à esquerda,
notando-se ampla comunicação entre o seio maxilar, cavidade oral e cavidade nasal deste lado
Espessamento de planos musculo adiposos do espaço mastigatório esquerdo, com realce ao meio de
contraste (tecido fibrocicatricial? lesão remanecescente?). Sugere-se controle.
Assimetria de realce das glândulas parótidas, maior à esquerda.
Espessamento mucoso do seio frontal esquerdo e de células etmoidais ipsilaterais.
Acúmulo de secreção nas mastoides bilateralmente.
